# Patient Record
Sex: Female | Born: 1955 | Race: White | Hispanic: No | Marital: Married | Smoking: Current some day smoker
Health system: Southern US, Community
[De-identification: ages and names within clinical notes are randomized; demographics above are authoritative.]

## PROBLEM LIST (undated history)

## (undated) DIAGNOSIS — C50919 Malignant neoplasm of unspecified site of unspecified female breast: Secondary | ICD-10-CM

## (undated) DIAGNOSIS — E785 Hyperlipidemia, unspecified: Secondary | ICD-10-CM

## (undated) DIAGNOSIS — Z923 Personal history of irradiation: Secondary | ICD-10-CM

## (undated) DIAGNOSIS — R112 Nausea with vomiting, unspecified: Secondary | ICD-10-CM

## (undated) DIAGNOSIS — Z9889 Other specified postprocedural states: Secondary | ICD-10-CM

## (undated) HISTORY — PX: TUBAL LIGATION: SHX77

## (undated) HISTORY — PX: BREAST LUMPECTOMY: SHX2

## (undated) HISTORY — DX: Hyperlipidemia, unspecified: E78.5

## (undated) HISTORY — PX: BREAST BIOPSY: SHX20

## (undated) HISTORY — PX: ECTOPIC PREGNANCY SURGERY: SHX613

## (undated) HISTORY — PX: OTHER SURGICAL HISTORY: SHX169

---

## 1998-11-17 ENCOUNTER — Ambulatory Visit (HOSPITAL_COMMUNITY): Admission: RE | Admit: 1998-11-17 | Discharge: 1998-11-17 | Payer: Self-pay | Admitting: Internal Medicine

## 1999-10-18 ENCOUNTER — Other Ambulatory Visit: Admission: RE | Admit: 1999-10-18 | Discharge: 1999-10-18 | Payer: Self-pay | Admitting: Obstetrics & Gynecology

## 2009-10-25 ENCOUNTER — Ambulatory Visit: Payer: Self-pay | Admitting: Internal Medicine

## 2009-10-25 ENCOUNTER — Other Ambulatory Visit: Admission: RE | Admit: 2009-10-25 | Discharge: 2009-10-25 | Payer: Self-pay | Admitting: Internal Medicine

## 2009-11-03 ENCOUNTER — Encounter: Admission: RE | Admit: 2009-11-03 | Discharge: 2009-11-03 | Payer: Self-pay | Admitting: Internal Medicine

## 2009-11-10 ENCOUNTER — Encounter: Admission: RE | Admit: 2009-11-10 | Discharge: 2009-11-10 | Payer: Self-pay | Admitting: Internal Medicine

## 2009-11-18 ENCOUNTER — Encounter: Admission: RE | Admit: 2009-11-18 | Discharge: 2009-11-18 | Payer: Self-pay | Admitting: Internal Medicine

## 2010-01-28 ENCOUNTER — Ambulatory Visit: Payer: Self-pay | Admitting: Internal Medicine

## 2011-03-27 ENCOUNTER — Encounter: Payer: Self-pay | Admitting: Internal Medicine

## 2011-03-30 ENCOUNTER — Other Ambulatory Visit: Payer: Self-pay | Admitting: Internal Medicine

## 2011-03-30 ENCOUNTER — Other Ambulatory Visit: Payer: Private Health Insurance - Indemnity | Admitting: Internal Medicine

## 2011-03-30 DIAGNOSIS — Z0000 Encounter for general adult medical examination without abnormal findings: Secondary | ICD-10-CM

## 2011-03-30 DIAGNOSIS — E785 Hyperlipidemia, unspecified: Secondary | ICD-10-CM

## 2011-03-30 LAB — CBC WITH DIFFERENTIAL/PLATELET
Basophils Absolute: 0.1 10*3/uL (ref 0.0–0.1)
Eosinophils Absolute: 0.2 10*3/uL (ref 0.0–0.7)
MCH: 32.9 pg (ref 26.0–34.0)
MCHC: 33.3 g/dL (ref 30.0–36.0)
MCV: 98.8 fL (ref 78.0–100.0)
Monocytes Relative: 7 % (ref 3–12)
Platelets: 328 10*3/uL (ref 150–400)
RDW: 13.8 % (ref 11.5–15.5)
WBC: 5.9 10*3/uL (ref 4.0–10.5)

## 2011-03-30 LAB — BASIC METABOLIC PANEL
BUN: 9 mg/dL (ref 6–23)
Chloride: 105 mEq/L (ref 96–112)
Potassium: 4.5 mEq/L (ref 3.5–5.3)
Sodium: 140 mEq/L (ref 135–145)

## 2011-03-30 LAB — LIPID PANEL
LDL Cholesterol: 145 mg/dL — ABNORMAL HIGH (ref 0–99)
VLDL: 19 mg/dL (ref 0–40)

## 2011-03-30 LAB — HEPATIC FUNCTION PANEL
Bilirubin, Direct: 0.1 mg/dL (ref 0.0–0.3)
Indirect Bilirubin: 0.3 mg/dL (ref 0.0–0.9)

## 2011-03-30 LAB — VITAMIN D 25 HYDROXY (VIT D DEFICIENCY, FRACTURES): Vit D, 25-Hydroxy: 27 ng/mL — ABNORMAL LOW (ref 30–89)

## 2011-03-31 ENCOUNTER — Other Ambulatory Visit: Payer: Self-pay

## 2011-03-31 ENCOUNTER — Ambulatory Visit (INDEPENDENT_AMBULATORY_CARE_PROVIDER_SITE_OTHER): Payer: Managed Care, Other (non HMO) | Admitting: Internal Medicine

## 2011-03-31 ENCOUNTER — Encounter: Payer: Self-pay | Admitting: Internal Medicine

## 2011-03-31 VITALS — BP 104/72 | HR 76 | Temp 97.9°F | Ht 70.0 in | Wt 168.0 lb

## 2011-03-31 DIAGNOSIS — E559 Vitamin D deficiency, unspecified: Secondary | ICD-10-CM | POA: Insufficient documentation

## 2011-03-31 DIAGNOSIS — Z0000 Encounter for general adult medical examination without abnormal findings: Secondary | ICD-10-CM

## 2011-03-31 DIAGNOSIS — E785 Hyperlipidemia, unspecified: Secondary | ICD-10-CM

## 2011-03-31 DIAGNOSIS — Z87891 Personal history of nicotine dependence: Secondary | ICD-10-CM

## 2011-03-31 LAB — POCT URINALYSIS DIPSTICK
Protein, UA: NEGATIVE
Spec Grav, UA: 1.01
pH, UA: 7

## 2011-03-31 LAB — HEMOGLOBIN A1C: Hgb A1c MFr Bld: 5.9 % — ABNORMAL HIGH (ref <5.7)

## 2011-03-31 NOTE — Progress Notes (Signed)
Subjective:    Patient ID: Jamie Gilbert, female    DOB: [birth year], 55 y.o.   MRN: [id number]  HPI  pleasant white female with history of hyperlipidemia and vitamin D deficiency for physical examination. History of right middle lobe pneumonia January 2000. Had infectious colitis Nov 28, 1998. Ectopic pregnancy and bilateral tubal ligation 1990. Torn medial meniscus of left knee with plica being excised 1997 subsequently developed reflex sympathetic dystrophy post surgery. Last year she had an abnormal mammogram. Breast biopsy was benign. Last year I started her on Zocor 20 mg daily which she has taken with some compliance. Hasn't been taking it regularly. In May she called it was not taking vitamin D supplement. We gave her 50,000 units for 12 weeks. Her level is now 27. She needs to be on 2000 units daily which she finds hard to remember to take. She has been  out of the country a lot. 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 Given DTaP vaccine 2009/11/28. Patient continues to smoke about a pack of cigarettes per day and he smoked for some 20 years. Social alcohol consumption. Doesn't get a lot of exercise other than yard work or walking. Has lost 11 pounds since 11/28/2009.  Family history mother died about 4 years ago at age 1 with an abdominal aortic aneurysm. She had a history of hypertension and was a former smoker. Father died at age 66 with a brain tumor. 3 brothers in their 72s in good health with the exception of high cholesterol. No sisters. 2 adult female children in good health.   In 2011 total cholesterol was 218 with an LDL cholesterol of 244. Labs were reviewed with her today. She needs to continue with same dose of Zocor and take it regularly. She is to have a screening colonoscopy and will consider it. Needs to have mammogram    Review of Systems  Constitutional: Negative.   HENT: Positive for congestion.   Eyes: Negative.     Respiratory: Negative.   Cardiovascular: Negative.   Gastrointestinal: Negative.   Genitourinary: Negative.   Musculoskeletal: Negative.   Neurological: Negative.   Hematological: Negative.   Psychiatric/Behavioral: Negative.        Objective:   Physical Exam  Constitutional: No distress.  HENT:  Head: Normocephalic and atraumatic.  Right Ear: External ear normal.  Left Ear: External ear normal.  Mouth/Throat: Oropharynx is clear and moist. No oropharyngeal exudate.  Eyes: Pupils are equal, round, and reactive to light. Left eye exhibits no discharge. No scleral icterus.  Neck: Neck supple. No JVD present. No thyromegaly present.  Cardiovascular: Normal rate, regular rhythm, normal heart sounds and intact distal pulses.  Exam reveals no gallop.   No murmur heard. Pulmonary/Chest: Effort normal and breath sounds normal. She has no wheezes. She has no rales.  Abdominal: Soft. Bowel sounds are normal. She exhibits no distension and no mass. There is no tenderness. There is no rebound and no guarding.  Genitourinary:       Bimanual normal. Pap last year was normal.  Musculoskeletal: Normal range of motion. She exhibits no edema.  Lymphadenopathy:    She has no cervical adenopathy.  Skin: Skin is warm and dry. No rash noted. She is not diaphoretic.          Assessment & Plan:  Hyperlipidemia: Patient has not been entirely compliant with Zocor 20 mg daily. Continue same dose and recheck in 6 months.  Vitamin D  deficiency: Vitamin D level is 27. Recommend 2000 international units vitamin D 3 over-the-counter daily.  Health maintenance needs mammogram and colonoscopy

## 2011-03-31 NOTE — Patient Instructions (Signed)
Counseled regarding to stopping smoking but she is really not itched it at this point in time. Take lipid medication on a regular basis. Take vitamin D 2000 units daily. See in 6 months at which time repeat lipid panel liver functions and office visit

## 2011-04-03 ENCOUNTER — Encounter: Payer: Self-pay | Admitting: Internal Medicine

## 2011-10-16 ENCOUNTER — Other Ambulatory Visit (INDEPENDENT_AMBULATORY_CARE_PROVIDER_SITE_OTHER): Payer: Managed Care, Other (non HMO) | Admitting: Internal Medicine

## 2011-10-16 DIAGNOSIS — Z79899 Other long term (current) drug therapy: Secondary | ICD-10-CM

## 2011-10-16 DIAGNOSIS — E785 Hyperlipidemia, unspecified: Secondary | ICD-10-CM

## 2011-10-16 LAB — LIPID PANEL
Total CHOL/HDL Ratio: 6 Ratio
Triglycerides: 102 mg/dL (ref <150)

## 2011-10-16 LAB — HEPATIC FUNCTION PANEL
ALT: 11 U/L (ref 0–35)
Albumin: 4.6 g/dL (ref 3.5–5.2)
Bilirubin, Direct: 0.1 mg/dL (ref 0.0–0.3)
Total Protein: 6.9 g/dL (ref 6.0–8.3)

## 2011-10-16 LAB — HEMOGLOBIN A1C: Mean Plasma Glucose: 120 mg/dL — ABNORMAL HIGH (ref <117)

## 2011-10-17 ENCOUNTER — Ambulatory Visit (INDEPENDENT_AMBULATORY_CARE_PROVIDER_SITE_OTHER): Payer: Managed Care, Other (non HMO) | Admitting: Internal Medicine

## 2011-10-17 ENCOUNTER — Encounter: Payer: Self-pay | Admitting: Internal Medicine

## 2011-10-17 VITALS — BP 120/84 | HR 76 | Temp 97.7°F | Wt 170.0 lb

## 2011-10-17 DIAGNOSIS — J40 Bronchitis, not specified as acute or chronic: Secondary | ICD-10-CM

## 2011-10-17 DIAGNOSIS — H6690 Otitis media, unspecified, unspecified ear: Secondary | ICD-10-CM

## 2011-10-17 DIAGNOSIS — H6691 Otitis media, unspecified, right ear: Secondary | ICD-10-CM

## 2011-10-17 DIAGNOSIS — E785 Hyperlipidemia, unspecified: Secondary | ICD-10-CM

## 2011-10-17 DIAGNOSIS — IMO0002 Reserved for concepts with insufficient information to code with codable children: Secondary | ICD-10-CM

## 2011-10-17 DIAGNOSIS — Z7189 Other specified counseling: Secondary | ICD-10-CM

## 2011-10-17 DIAGNOSIS — J329 Chronic sinusitis, unspecified: Secondary | ICD-10-CM

## 2011-10-17 NOTE — Patient Instructions (Signed)
Take Levaquin 500 milligrams daily for 10 days for bronchitis and sinusitis. For international travel, please take with you Cipro 500 mg twice daily for 7 days and Phenergan 25 mg tablets 1 by mouth every 4 hours when necessary nausea. Please be compliant with Zocor. Return in 6 months for physical exam.

## 2011-10-17 NOTE — Progress Notes (Signed)
  Subjective:    Patient ID: Jamie Gilbert, female    DOB: 02-01-1956, 56 y.o.   MRN: [id number]  HPI  56 year old white female long-standing history of hyperlipidemia treated with Zocor 40 mg daily. Admits to not being compliant with Zocor over the holidays. Fasting lipid panel certainly confirms that with total cholesterol being in the 300 range. Patient has been traveling a great deal. Has upcoming trips to [place] and [place]. Husband has a winery in [place] with plans to travel there in the spring.   Also has discolored sputum production and nasal congestion. Patient is a smoker. Has had respiratory congestion for a couple of weeks. No fever or shaking chills. Did not take influenza immunization.    Review of Systems     Objective:   Physical ExamHEENT exam right TM is red and full, left TM clear, pharynx is slightly injected without exudate, neck is supple without significant adenopathy, chest clear to auscultation. She sounds nasally congested.        Assessment & Plan:    Hyperlipidemia poorly controlled because of noncompliance with Zocor  Sinusitis  Bronchitis  Right otitis media  Smoker  Travel advice  Plan: Encouraged patient to be compliant with Zocor 40 mg daily. Return in 6 months for physical examination.  Prescription for Zocor 40 mg one by mouth daily #30 with 5 refills. Treat bronchitis and sinusitis with Levaquin 500 milligrams daily for 10 days. She declines offer for cough medication. 4 traveling to foreign countries, Cipro 500 mg by mouth twice daily #14 with one refill as well as Phenergan 25 mg tablet spritzers #30) 1 by mouth every 4 hours when necessary nausea with one refill.

## 2011-10-31 ENCOUNTER — Other Ambulatory Visit: Payer: Self-pay

## 2011-10-31 ENCOUNTER — Telehealth: Payer: Self-pay | Admitting: Internal Medicine

## 2011-10-31 DIAGNOSIS — J329 Chronic sinusitis, unspecified: Secondary | ICD-10-CM

## 2011-10-31 MED ORDER — CLARITHROMYCIN 500 MG PO TABS: 500.0000 mg | ORAL_TABLET | Freq: Two times a day (BID) | ORAL | Status: AC

## 2011-10-31 NOTE — Telephone Encounter (Signed)
Call in Biaxin 500mg  twice daily for 10 days to [facility].

## 2012-04-16 ENCOUNTER — Other Ambulatory Visit: Payer: Managed Care, Other (non HMO) | Admitting: Internal Medicine

## 2012-04-18 ENCOUNTER — Encounter: Payer: Managed Care, Other (non HMO) | Admitting: Internal Medicine

## 2012-11-25 ENCOUNTER — Other Ambulatory Visit: Payer: Self-pay

## 2012-11-25 MED ORDER — SIMVASTATIN 20 MG PO TABS: 20.0000 mg | ORAL_TABLET | Freq: Every day | ORAL | Status: DC

## 2013-01-16 ENCOUNTER — Other Ambulatory Visit: Payer: Managed Care, Other (non HMO) | Admitting: Internal Medicine

## 2013-01-16 LAB — CBC WITH DIFFERENTIAL/PLATELET
Basophils Relative: 1 % (ref 0–1)
Eosinophils Absolute: 0.2 10*3/uL (ref 0.0–0.7)
Eosinophils Relative: 4 % (ref 0–5)
MCH: 30.7 pg (ref 26.0–34.0)
Monocytes Absolute: 0.4 10*3/uL (ref 0.1–1.0)
Monocytes Relative: 7 % (ref 3–12)
Neutrophils Relative %: 52 % (ref 43–77)
RBC: 4.43 MIL/uL (ref 3.87–5.11)
RDW: 14.6 % (ref 11.5–15.5)
WBC: 5.4 10*3/uL (ref 4.0–10.5)

## 2013-01-16 LAB — LIPID PANEL
HDL: 73 mg/dL (ref >39)
LDL Cholesterol: 207 mg/dL — ABNORMAL HIGH (ref 0–99)

## 2013-01-16 LAB — COMPREHENSIVE METABOLIC PANEL
ALT: 13 U/L (ref 0–35)
AST: 16 U/L (ref 0–37)
Albumin: 4.4 g/dL (ref 3.5–5.2)
Alkaline Phosphatase: 59 U/L (ref 39–117)
Calcium: 9.4 mg/dL (ref 8.4–10.5)
Potassium: 4.1 mEq/L (ref 3.5–5.3)
Total Protein: 6.8 g/dL (ref 6.0–8.3)

## 2013-01-17 ENCOUNTER — Ambulatory Visit (INDEPENDENT_AMBULATORY_CARE_PROVIDER_SITE_OTHER): Payer: Managed Care, Other (non HMO) | Admitting: Internal Medicine

## 2013-01-17 ENCOUNTER — Encounter: Payer: Self-pay | Admitting: Internal Medicine

## 2013-01-17 ENCOUNTER — Other Ambulatory Visit (HOSPITAL_COMMUNITY)
Admission: RE | Admit: 2013-01-17 | Discharge: 2013-01-17 | Disposition: A | Discharge status: Home / Self Care | Payer: Managed Care, Other (non HMO) | Source: Ambulatory Visit | Attending: Internal Medicine | Admitting: Internal Medicine

## 2013-01-17 VITALS — BP 118/72 | HR 72 | Temp 97.1°F | Wt 168.0 lb

## 2013-01-17 DIAGNOSIS — Z0000 Encounter for general adult medical examination without abnormal findings: Secondary | ICD-10-CM

## 2013-01-17 DIAGNOSIS — Z01419 Encounter for gynecological examination (general) (routine) without abnormal findings: Secondary | ICD-10-CM | POA: Insufficient documentation

## 2013-01-17 DIAGNOSIS — E785 Hyperlipidemia, unspecified: Secondary | ICD-10-CM

## 2013-01-17 DIAGNOSIS — IMO0001 Reserved for inherently not codable concepts without codable children: Secondary | ICD-10-CM

## 2013-01-17 DIAGNOSIS — F17200 Nicotine dependence, unspecified, uncomplicated: Secondary | ICD-10-CM

## 2013-01-17 DIAGNOSIS — Z8639 Personal history of other endocrine, nutritional and metabolic disease: Secondary | ICD-10-CM

## 2013-01-17 LAB — POCT URINALYSIS DIPSTICK
Bilirubin, UA: NEGATIVE
Blood, UA: NEGATIVE
Glucose, UA: NEGATIVE
pH, UA: 6

## 2013-01-17 LAB — VITAMIN D 25 HYDROXY (VIT D DEFICIENCY, FRACTURES): Vit D, 25-Hydroxy: 20 ng/mL — ABNORMAL LOW (ref 30–89)

## 2013-01-17 MED ORDER — SIMVASTATIN 20 MG PO TABS: 40.0000 mg | ORAL_TABLET | Freq: Every day | ORAL | Status: DC

## 2013-02-16 NOTE — Progress Notes (Signed)
  Subjective:    Patient ID: Jamie Gilbert, female    DOB: Nov 12, 1955, 57 y.o.   MRN: [id number]  HPI 57 year old white female with history of hyperlipidemia and vitamin D deficiency. Has not been taking lipid-lowering medication.  History of right middle lobe pneumonia January 2000. Had infectious colitis February 2000. Ectopic pregnancy and bilateral tubal ligation 1990. Torn medial meniscus of left knee with plica being excised in 1997 and subsequently developed regional pain syndrome post surgery. History of abnormal mammogram 2011. Breast biopsy was benign. In 2011 she was started on Zocor which she has not been compliant with from when initial prescription was originated. Also hasn't been taking vitamin D supplementation.  Patient continues to smoke a pack of cigarettes daily and has smoked for over 20 years. Social alcohol consumption.  Social history: This is her second marriage. Husband owns a farm in [place] and they are making wine there. 2 adult female children in good health. No children from second marriage.  Family history: Mother died with abdominal aortic aneurysm at age 35. She had history of hypertension and was a former smoker. Father died at age 71 with a brain tumor. 3 brothers in their 64s in good health with the exception of hyperlipidemia. No sisters.    Review of Systems  Constitutional: Negative.   All other systems reviewed and are negative.       Objective:   Physical Exam  Vitals reviewed. Constitutional: She is oriented to person, place, and time. She appears well-developed and well-nourished. No distress.  HENT:  Head: Normocephalic.  Right Ear: External ear normal.  Left Ear: External ear normal.  Mouth/Throat: Oropharynx is clear and moist. No oropharyngeal exudate.  Eyes: Conjunctivae and EOM are normal. Right eye exhibits no discharge. Left eye exhibits no discharge. No scleral icterus.  Neck: Neck supple. No JVD present. No thyromegaly present.   Cardiovascular: Normal rate, regular rhythm, normal heart sounds and intact distal pulses.   No murmur heard. Pulmonary/Chest: Effort normal and breath sounds normal. No respiratory distress. She has no wheezes. She has no rales. She exhibits no tenderness.  Breasts normal female  Abdominal: Soft. Bowel sounds are normal. She exhibits no distension and no mass. There is no tenderness. There is no rebound and no guarding.  Genitourinary: Vagina normal.  Pap taken. Bimanual normal.  Musculoskeletal: She exhibits no edema.  Lymphadenopathy:    She has no cervical adenopathy.  Neurological: She is alert and oriented to person, place, and time. She has normal reflexes. No cranial nerve deficit. Coordination normal.  Skin: Skin is warm and dry. No rash noted. She is not diaphoretic.  Psychiatric: She has a normal mood and affect. Her behavior is normal. Judgment and thought content normal.          Assessment & Plan:  Hyperlipidemia. Patient agrees to go back on Zocor. Total cholesterol 298 with LDL cholesterol 207 off Zocor  History of vitamin D deficiency. Recommend 2000 units vitamin D 3 daily  History of smoking-patient does not want to quit  Health maintenance: Recommend mammogram and colonoscopy as well as bone density study  Plan: Return in 6 months for office visit lipid panel liver functions if compliant with Zocor

## 2013-02-16 NOTE — Patient Instructions (Addendum)
Reminded to quit smoking. Patient is not ready. Take Zocor as directed for high cholesterol. Return in 6 months. Take vitamin D supplement daily. Consider colonoscopy. Have mammogram and bone density study.

## 2013-02-26 ENCOUNTER — Telehealth: Payer: Self-pay | Admitting: Internal Medicine

## 2013-02-27 ENCOUNTER — Other Ambulatory Visit: Payer: Self-pay

## 2013-02-27 DIAGNOSIS — G894 Chronic pain syndrome: Secondary | ICD-10-CM

## 2013-02-27 MED ORDER — IBUPROFEN 800 MG PO TABS: 800.0000 mg | ORAL_TABLET | Freq: Three times a day (TID) | ORAL | Status: DC | PRN

## 2013-02-27 NOTE — Telephone Encounter (Signed)
Will do. Please prescibe Ibuprofen 800 mg tid with no  Refills #90 for patient.

## 2013-04-22 ENCOUNTER — Other Ambulatory Visit: Payer: Self-pay | Admitting: Internal Medicine

## 2013-08-29 ENCOUNTER — Telehealth: Payer: Self-pay | Admitting: Internal Medicine

## 2013-08-29 ENCOUNTER — Ambulatory Visit
Admission: RE | Admit: 2013-08-29 | Discharge: 2013-08-29 | Disposition: A | Discharge status: Home / Self Care | Payer: Managed Care, Other (non HMO) | Source: Ambulatory Visit | Attending: Internal Medicine | Admitting: Internal Medicine

## 2013-08-29 ENCOUNTER — Ambulatory Visit (INDEPENDENT_AMBULATORY_CARE_PROVIDER_SITE_OTHER): Payer: Managed Care, Other (non HMO) | Admitting: Internal Medicine

## 2013-08-29 ENCOUNTER — Encounter: Payer: Self-pay | Admitting: Internal Medicine

## 2013-08-29 VITALS — BP 132/80 | HR 84 | Temp 97.9°F | Ht 70.0 in | Wt 173.0 lb

## 2013-08-29 DIAGNOSIS — J209 Acute bronchitis, unspecified: Secondary | ICD-10-CM

## 2013-08-29 DIAGNOSIS — J189 Pneumonia, unspecified organism: Secondary | ICD-10-CM

## 2013-08-29 DIAGNOSIS — J9801 Acute bronchospasm: Secondary | ICD-10-CM

## 2013-08-29 DIAGNOSIS — Z87891 Personal history of nicotine dependence: Secondary | ICD-10-CM

## 2013-08-29 MED ORDER — CEFTRIAXONE SODIUM 1 G IJ SOLR
1.0000 g | Freq: Once | INTRAMUSCULAR | Status: AC
  Administered 2013-08-29: 1 g via INTRAMUSCULAR

## 2013-08-29 MED ORDER — LEVOFLOXACIN 500 MG PO TABS: 500.0000 mg | ORAL_TABLET | Freq: Every day | ORAL | Status: DC

## 2013-08-29 MED ORDER — ALBUTEROL SULFATE HFA 108 (90 BASE) MCG/ACT IN AERS: 2.0000 | INHALATION_SPRAY | Freq: Four times a day (QID) | RESPIRATORY_TRACT | Status: DC | PRN

## 2013-08-29 NOTE — Patient Instructions (Signed)
Take Levaquin 500 milligrams daily for 10 days. Use Proventil inhaler 2 sprays 4 times daily. If wheezing and congestion worsen, start Medrol 4 mg 6 day dosepak. Have chest x-ray.

## 2013-08-29 NOTE — Progress Notes (Signed)
   Subjective:    Patient ID: Jamie Gilbert, female    DOB: 1956-03-11, 57 y.o.   MRN: [id number]  HPI  Smoking one ppd. Came down with URI symptoms after Thanksgiving. No fever. No chills. Discolored sputum production. No SOB. Has malaise and fatigue. Counsel regard smoking cessation. Has been traveling a lot.    Review of Systems     Objective:   Physical Exam TMs are slightly full bilaterally. Pharynx is clear. Neck is supple without adenopathy. Chest bilateral rhonchi. No distinct wheezing. No distinct rales.        Assessment & Plan:  Bronchitis  History smoking  Plan: Chest x-ray PA and lateral. Levaquin 500 milligrams daily for 10 days. If coughing and wheezing persist, try Medrol 4 mg 6 day dosepak. Ventolin inhaler 2 sprays by mouth 4 times a day.  Addendum: Likely has early pneumonia. Take medication as prescribed return here in 2 weeks for followup visit and chest x-ray. Needs to quit smoking. Syncopal and in and recurrent and is a car and in massage prescription she's on was levels are running we'll [place] and thought this is an will is on her stool and she will and referred to the nearest wants to fall at the site sounds 1 appears to this for a reck in 1 and lives in a.

## 2013-08-29 NOTE — Telephone Encounter (Signed)
Verbalized understanding of these instructions.

## 2013-09-01 ENCOUNTER — Other Ambulatory Visit: Payer: Self-pay | Admitting: *Deleted

## 2013-09-01 DIAGNOSIS — J189 Pneumonia, unspecified organism: Secondary | ICD-10-CM

## 2013-09-09 ENCOUNTER — Ambulatory Visit
Admission: RE | Admit: 2013-09-09 | Discharge: 2013-09-09 | Disposition: A | Discharge status: Home / Self Care | Payer: Managed Care, Other (non HMO) | Source: Ambulatory Visit | Attending: Internal Medicine | Admitting: Internal Medicine

## 2013-09-09 ENCOUNTER — Encounter: Payer: Self-pay | Admitting: Internal Medicine

## 2013-09-09 ENCOUNTER — Ambulatory Visit (INDEPENDENT_AMBULATORY_CARE_PROVIDER_SITE_OTHER): Payer: Managed Care, Other (non HMO) | Admitting: Internal Medicine

## 2013-09-09 VITALS — BP 140/70 | HR 80 | Temp 98.0°F | Resp 18 | Wt 170.0 lb

## 2013-09-09 DIAGNOSIS — Z8709 Personal history of other diseases of the respiratory system: Secondary | ICD-10-CM

## 2013-09-09 DIAGNOSIS — J189 Pneumonia, unspecified organism: Secondary | ICD-10-CM

## 2013-09-09 MED ORDER — LEVOFLOXACIN 500 MG PO TABS: 500.0000 mg | ORAL_TABLET | Freq: Every day | ORAL | Status: DC

## 2013-09-14 DIAGNOSIS — Z8709 Personal history of other diseases of the respiratory system: Secondary | ICD-10-CM | POA: Insufficient documentation

## 2013-09-14 NOTE — Patient Instructions (Signed)
Please quit smoking. Return as needed.

## 2013-09-14 NOTE — Progress Notes (Signed)
   Subjective:    Patient ID: Jamie Gilbert, female    DOB: [birth year], 57 y.o.   MRN: [id number]  HPI On December 12 presented with respiratory infection and chest x-ray was consistent with developing pneumonia in the left retrocardiac area. She still feels quite fatigued. Chest x-ray has improved. She is feeling better. Spoke with her today about smoking cessation. She realizes she needs to quit. She's asking about electronic cigarettes. I would prefer she try nicotine patches instead or some type of quit smoking program. Chest x-ray did show COPD and she was told this today. She did not take steroids as previously prescribed just the antibiotic.    Review of Systems     Objective:   Physical Exam Neck is supple without JVD thyromegaly or carotid bruits. Chest clear to auscultation.       Assessment & Plan:  Resolving pneumonia  COPD  History of smoking  Plan: Counseled regarding smoking cessation. Return as needed. Suggested she may want to participate in classes held by [facility].

## 2013-11-28 ENCOUNTER — Telehealth: Payer: Self-pay | Admitting: Internal Medicine

## 2013-11-28 DIAGNOSIS — R110 Nausea: Secondary | ICD-10-CM

## 2013-11-28 NOTE — Telephone Encounter (Signed)
Call in Cipro 500 mg bid x 10 days (#20) and Phenergan 25 mg tabs #30 one po q 4 hours prn nausea for trip to [place].

## 2013-12-01 ENCOUNTER — Other Ambulatory Visit: Payer: Self-pay

## 2013-12-01 MED ORDER — CIPROFLOXACIN HCL 500 MG PO TABS: 500.0000 mg | ORAL_TABLET | Freq: Two times a day (BID) | ORAL | Status: DC

## 2013-12-01 MED ORDER — PROMETHAZINE HCL 25 MG PO TABS: 25.0000 mg | ORAL_TABLET | ORAL | Status: DC | PRN

## 2013-12-01 NOTE — Telephone Encounter (Signed)
Patient informed that Rx sent to [facility].

## 2014-03-26 ENCOUNTER — Other Ambulatory Visit: Payer: Self-pay | Admitting: Internal Medicine

## 2014-06-01 ENCOUNTER — Other Ambulatory Visit: Payer: Self-pay

## 2014-06-01 DIAGNOSIS — Z1231 Encounter for screening mammogram for malignant neoplasm of breast: Secondary | ICD-10-CM

## 2014-06-10 ENCOUNTER — Ambulatory Visit
Admission: RE | Admit: 2014-06-10 | Discharge: 2014-06-10 | Disposition: A | Discharge status: Home / Self Care | Payer: Managed Care, Other (non HMO) | Source: Ambulatory Visit

## 2014-06-10 DIAGNOSIS — Z1231 Encounter for screening mammogram for malignant neoplasm of breast: Secondary | ICD-10-CM

## 2014-08-20 ENCOUNTER — Encounter: Payer: Self-pay | Admitting: Internal Medicine

## 2014-08-20 ENCOUNTER — Ambulatory Visit (INDEPENDENT_AMBULATORY_CARE_PROVIDER_SITE_OTHER): Payer: Managed Care, Other (non HMO) | Admitting: Internal Medicine

## 2014-08-20 VITALS — BP 120/78 | HR 71 | Temp 98.6°F | Ht 70.0 in | Wt 171.0 lb

## 2014-08-20 DIAGNOSIS — J209 Acute bronchitis, unspecified: Secondary | ICD-10-CM

## 2014-08-20 MED ORDER — LEVOFLOXACIN 500 MG PO TABS: 500.0000 mg | ORAL_TABLET | Freq: Every day | ORAL | Status: DC

## 2014-08-20 NOTE — Progress Notes (Signed)
   Subjective:    Patient ID: Jamie Gilbert, female    DOB: [birth year], 58 y.o.   MRN: [id number]  HPI 2 day history of cough and nasal congestion. Cough sounds deep and congested. No fever or shaking chills. Had similar illness in December last year. Going out of town in the next few days. Wanted to get seen as soon as possible. She continues to smoke. Have tried to get her to quit in the past.    Review of Systems     Objective:   Physical Exam   Skin warm and dry. Nodes none. TMs are clear. Pharynx is clear. Neck supple. Chest clear to auscultation without rales or wheezing. Deep congested cough     Assessment & Plan:  Acute bronchitis  Plan: Levaquin 500 milligrams daily for 10 days. Declines offer for cough syrup. Does not want inhaler.

## 2014-08-20 NOTE — Patient Instructions (Signed)
Levaquin 500 mg daily x 10 days. Declines cough medication.

## 2014-08-28 ENCOUNTER — Ambulatory Visit: Payer: Self-pay | Admitting: Internal Medicine

## 2014-08-28 ENCOUNTER — Other Ambulatory Visit: Payer: Self-pay

## 2014-08-28 ENCOUNTER — Telehealth: Payer: Self-pay | Admitting: Internal Medicine

## 2014-08-28 DIAGNOSIS — R059 Cough, unspecified: Secondary | ICD-10-CM

## 2014-08-28 DIAGNOSIS — R6883 Chills (without fever): Secondary | ICD-10-CM

## 2014-08-28 DIAGNOSIS — R05 Cough: Secondary | ICD-10-CM

## 2014-08-28 MED ORDER — LEVOFLOXACIN 500 MG PO TABS: 500.0000 mg | ORAL_TABLET | Freq: Every day | ORAL | Status: DC

## 2014-08-28 NOTE — Telephone Encounter (Signed)
No fever the last 3-4 days, still having some chills at night.  Still not feeling her best after 8 days of the antibiotics.  Do you want to see her back?  Or, do you think she needs another round of antibiotics?  Please advise?    [phone]

## 2014-08-28 NOTE — Telephone Encounter (Signed)
Chest xray ordered.  Left message for patient to get xray and follow up in the office 08/28/2014 at 315.

## 2014-08-28 NOTE — Telephone Encounter (Signed)
She needs CXR and office visit today.

## 2014-08-31 ENCOUNTER — Ambulatory Visit (INDEPENDENT_AMBULATORY_CARE_PROVIDER_SITE_OTHER): Payer: Managed Care, Other (non HMO) | Admitting: Internal Medicine

## 2014-08-31 ENCOUNTER — Encounter: Payer: Self-pay | Admitting: Internal Medicine

## 2014-08-31 ENCOUNTER — Ambulatory Visit
Admission: RE | Admit: 2014-08-31 | Discharge: 2014-08-31 | Disposition: A | Discharge status: Home / Self Care | Payer: Managed Care, Other (non HMO) | Source: Ambulatory Visit | Attending: Internal Medicine | Admitting: Internal Medicine

## 2014-08-31 VITALS — BP 120/80 | HR 72 | Temp 97.6°F | Wt 171.0 lb

## 2014-08-31 DIAGNOSIS — Z720 Tobacco use: Secondary | ICD-10-CM

## 2014-08-31 DIAGNOSIS — J209 Acute bronchitis, unspecified: Secondary | ICD-10-CM

## 2014-08-31 DIAGNOSIS — R05 Cough: Secondary | ICD-10-CM

## 2014-08-31 DIAGNOSIS — R059 Cough, unspecified: Secondary | ICD-10-CM

## 2014-08-31 DIAGNOSIS — Z87891 Personal history of nicotine dependence: Secondary | ICD-10-CM

## 2014-08-31 DIAGNOSIS — R6883 Chills (without fever): Secondary | ICD-10-CM

## 2014-08-31 NOTE — Patient Instructions (Addendum)
Complete another 10 day course of Levaquin that was prescribed December 11. She should pick it up today. Call if not better after that course of antibiotics.

## 2014-08-31 NOTE — Progress Notes (Signed)
   Subjective:    Patient ID: Jamie Gilbert, female    DOB: 03/17/1956, 58 y.o.   MRN: [id number]  HPI  She called on Friday, December 11 saying that she still had chills malaise and fatigue with cough. Would have completed 10 day course of Levaquin on December 13. 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 We suggested she have a chest x-ray but she decided to wait until today to do that. We refilled her Levaquin for an additional 10 days on December 11 but she has yet to pick that up, however would have not completed initial ten-day course of antibiotics until December 13. Says she's beginning to feel better.    Review of Systems     Objective:   Physical Exam Skin is warm and dry. She looks fatigued. Neck is supple. No adenopathy. Chest clear to auscultation. Chest x-ray no pneumonia       Assessment & Plan:  Bronchitis  Plan: Complete additional prescription of Levaquin 500 mg for 10 days that was called in on December 11. Call if symptoms do not improve.

## 2014-11-13 ENCOUNTER — Other Ambulatory Visit: Payer: Self-pay | Admitting: Internal Medicine

## 2014-11-16 ENCOUNTER — Other Ambulatory Visit: Payer: Self-pay | Admitting: Internal Medicine

## 2014-11-17 ENCOUNTER — Other Ambulatory Visit: Payer: Self-pay | Admitting: *Deleted

## 2014-11-17 MED ORDER — SIMVASTATIN 40 MG PO TABS: ORAL_TABLET | ORAL | Status: DC

## 2014-11-17 NOTE — Telephone Encounter (Signed)
Refill for zocor called into patient pharmacy

## 2014-11-19 ENCOUNTER — Other Ambulatory Visit: Payer: Self-pay | Admitting: Internal Medicine

## 2015-05-11 ENCOUNTER — Other Ambulatory Visit: Payer: Self-pay | Admitting: Internal Medicine

## 2015-05-12 NOTE — Telephone Encounter (Signed)
Last lipid panel and CPE 2014.

## 2015-06-10 ENCOUNTER — Other Ambulatory Visit: Payer: Managed Care, Other (non HMO) | Admitting: Internal Medicine

## 2015-06-10 ENCOUNTER — Other Ambulatory Visit: Payer: Self-pay | Admitting: Internal Medicine

## 2015-06-10 DIAGNOSIS — Z1329 Encounter for screening for other suspected endocrine disorder: Secondary | ICD-10-CM

## 2015-06-10 DIAGNOSIS — Z130 Encounter for screening for diseases of the blood and blood-forming organs and certain disorders involving the immune mechanism: Secondary | ICD-10-CM

## 2015-06-10 DIAGNOSIS — Z0000 Encounter for general adult medical examination without abnormal findings: Secondary | ICD-10-CM

## 2015-06-10 DIAGNOSIS — Z1321 Encounter for screening for nutritional disorder: Secondary | ICD-10-CM

## 2015-06-10 DIAGNOSIS — Z13220 Encounter for screening for lipoid disorders: Secondary | ICD-10-CM

## 2015-06-10 LAB — COMPLETE METABOLIC PANEL WITH GFR
ALK PHOS: 56 U/L (ref 33–130)
ALT: 13 U/L (ref 6–29)
AST: 18 U/L (ref 10–35)
Albumin: 4.6 g/dL (ref 3.6–5.1)
BILIRUBIN TOTAL: 0.6 mg/dL (ref 0.2–1.2)
BUN: 15 mg/dL (ref 7–25)
CO2: 25 mmol/L (ref 20–31)
CREATININE: 0.77 mg/dL (ref 0.50–1.05)
Calcium: 9.5 mg/dL (ref 8.6–10.4)
Chloride: 104 mmol/L (ref 98–110)
GFR, Est African American: >89 mL/min (ref >=60)
GFR, Est Non African American: 85 mL/min (ref >=60)
Glucose, Bld: 80 mg/dL (ref 65–99)
Potassium: 4.4 mmol/L (ref 3.5–5.3)
Sodium: 141 mmol/L (ref 135–146)
TOTAL PROTEIN: 7.2 g/dL (ref 6.1–8.1)

## 2015-06-10 LAB — CBC WITH DIFFERENTIAL/PLATELET
BASOS ABS: 0.1 10*3/uL (ref 0.0–0.1)
Basophils Relative: 1 % (ref 0–1)
Eosinophils Absolute: 0.1 10*3/uL (ref 0.0–0.7)
Eosinophils Relative: 1 % (ref 0–5)
HEMATOCRIT: 43.2 % (ref 36.0–46.0)
Hemoglobin: 14.3 g/dL (ref 12.0–15.0)
LYMPHS ABS: 2.1 10*3/uL (ref 0.7–4.0)
LYMPHS PCT: 37 % (ref 12–46)
MCH: 32.4 pg (ref 26.0–34.0)
MCHC: 33.1 g/dL (ref 30.0–36.0)
MCV: 97.7 fL (ref 78.0–100.0)
MPV: 9.3 fL (ref 8.6–12.4)
Monocytes Absolute: 0.4 10*3/uL (ref 0.1–1.0)
Monocytes Relative: 7 % (ref 3–12)
NEUTROS ABS: 3 10*3/uL (ref 1.7–7.7)
Neutrophils Relative %: 54 % (ref 43–77)
PLATELETS: 327 10*3/uL (ref 150–400)
RBC: 4.42 MIL/uL (ref 3.87–5.11)
RDW: 14.2 % (ref 11.5–15.5)
WBC: 5.6 10*3/uL (ref 4.0–10.5)

## 2015-06-10 LAB — LIPID PANEL
CHOL/HDL RATIO: 4 ratio (ref <=5.0)
Cholesterol: 294 mg/dL — ABNORMAL HIGH (ref 125–200)
HDL: 74 mg/dL (ref >=46)
LDL Cholesterol: 204 mg/dL — ABNORMAL HIGH (ref <130)
TRIGLYCERIDES: 80 mg/dL (ref <150)
VLDL: 16 mg/dL (ref <30)

## 2015-06-11 ENCOUNTER — Encounter: Payer: Self-pay | Admitting: Internal Medicine

## 2015-06-11 ENCOUNTER — Ambulatory Visit (INDEPENDENT_AMBULATORY_CARE_PROVIDER_SITE_OTHER): Payer: Managed Care, Other (non HMO) | Admitting: Internal Medicine

## 2015-06-11 VITALS — BP 118/78 | HR 82 | Temp 98.0°F | Ht 70.0 in | Wt 174.0 lb

## 2015-06-11 DIAGNOSIS — E2839 Other primary ovarian failure: Secondary | ICD-10-CM

## 2015-06-11 DIAGNOSIS — Z0000 Encounter for general adult medical examination without abnormal findings: Secondary | ICD-10-CM

## 2015-06-11 DIAGNOSIS — E785 Hyperlipidemia, unspecified: Secondary | ICD-10-CM

## 2015-06-11 DIAGNOSIS — Z720 Tobacco use: Secondary | ICD-10-CM

## 2015-06-11 DIAGNOSIS — E559 Vitamin D deficiency, unspecified: Secondary | ICD-10-CM

## 2015-06-11 DIAGNOSIS — Z87891 Personal history of nicotine dependence: Secondary | ICD-10-CM

## 2015-06-11 LAB — POCT URINALYSIS DIPSTICK
BILIRUBIN UA: NEGATIVE
Blood, UA: NEGATIVE
GLUCOSE UA: NEGATIVE
Ketones, UA: NEGATIVE
Leukocytes, UA: NEGATIVE
Nitrite, UA: NEGATIVE
Protein, UA: NEGATIVE
Spec Grav, UA: 1.02
Urobilinogen, UA: NEGATIVE
pH, UA: 5

## 2015-06-11 LAB — TSH: TSH: 2.208 u[IU]/mL (ref 0.350–4.500)

## 2015-06-11 LAB — HEMOGLOBIN A1C
HEMOGLOBIN A1C: 5.8 % — AB (ref <5.7)
Mean Plasma Glucose: 120 mg/dL — ABNORMAL HIGH (ref <117)

## 2015-06-11 LAB — VITAMIN D 25 HYDROXY (VIT D DEFICIENCY, FRACTURES): Vit D, 25-Hydroxy: 18 ng/mL — ABNORMAL LOW (ref 30–100)

## 2015-06-11 MED ORDER — SIMVASTATIN 40 MG PO TABS: ORAL_TABLET | ORAL | Status: DC

## 2015-06-12 ENCOUNTER — Encounter: Payer: Self-pay | Admitting: Internal Medicine

## 2015-06-12 NOTE — Patient Instructions (Signed)
Restart statin medication. Take 2000 units vitamin D 3 daily. Have mammogram and colonoscopy. Return in 6 months. It was a pleasure to see you today.

## 2015-06-12 NOTE — Progress Notes (Signed)
   Subjective:    Patient ID: Jamie Gilbert, female    DOB: 23-Feb-1956, 59 y.o.   MRN: [id number]  HPI 59 year old White Female in today for health maintenance examination and evaluation of medical problems. Has not been taking lipid-lowering medication in several weeks. History of hyperlipidemia, history of smoking, history of vitamin D deficiency. Was overdue for health maintenance exam and ran out of medication.  History of right middle lobe pneumonia January 2000. Infectious colitis February 2000. Ectopic pregnancy and bilateral tubal ligation 1990. Torn medial meniscus of left knee with Pleak of being excised 1997. Subsequently developed regional pain syndrome post surgery. History of abnormal mammogram 2011 with benign breast biopsy. In 2011, she was started on Zocor.  Patient used to smoke one to 2 packs of cigarettes daily. Is now down to a couple of packs a week. Social alcohol consumption.  Social history: This is her second marriage. 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 2 adult female children in good health. No children from second marriage. She travels extensively with her husband.  Family history: Mother died with abdominal aortic aneurysm and age 25. She had a history of hypertension and was a former smoker. Father died at age 24 with a brain tumor. 3 brothers in their 79s in good health with the exception of hyperlipidemia. No sisters.      Review of Systems  Constitutional: Negative.   Respiratory: Negative.   Cardiovascular: Negative.   Gastrointestinal: Negative.   Genitourinary: Negative.   All other systems reviewed and are negative.      Objective:   Physical Exam  Constitutional: She is oriented to person, place, and time. She appears well-developed and well-nourished. No distress.  HENT:  Head: Normocephalic and atraumatic.  Right Ear: External ear normal.  Left Ear: External ear normal.  Mouth/Throat: Oropharynx is clear  and moist. No oropharyngeal exudate.  Eyes: Conjunctivae and EOM are normal. Pupils are equal, round, and reactive to light. Right eye exhibits no discharge. Left eye exhibits no discharge. No scleral icterus.  Neck: Neck supple. No JVD present. No thyromegaly present.  Cardiovascular: Normal rate, regular rhythm and normal heart sounds.   No murmur heard. Pulmonary/Chest: Effort normal and breath sounds normal. No respiratory distress. She has no wheezes. She has no rales.  Abdominal: Soft. She exhibits no distension and no mass. There is no tenderness. There is no rebound and no guarding.  Genitourinary:  Pap done 2014. Bimanual normal.  Musculoskeletal: Normal range of motion. She exhibits no edema.  Lymphadenopathy:    She has no cervical adenopathy.  Neurological: She is alert and oriented to person, place, and time. She has normal reflexes. No cranial nerve deficit. Coordination normal.  Skin: Skin is warm and dry. No rash noted. She is not diaphoretic.  Psychiatric: She has a normal mood and affect. Her behavior is normal. Judgment and thought content normal.  Vitals reviewed.         Assessment & Plan:  Hyperlipidemia-restart Zocor and recheck lipid panel liver functions in 6 months  Vitamin D deficiency-take 2000 units vitamin D 3 daily  History of smoking-patient has been able to cut back substantially but has not quit  Health maintenance-needs mammogram and colonoscopy. Orders to be placed.  Plan: Return in 6 months. Will need office visit, lipid panel liver functions and vitamin D level.

## 2015-06-15 ENCOUNTER — Telehealth: Payer: Self-pay | Admitting: *Deleted

## 2015-06-15 NOTE — Telephone Encounter (Signed)
Reviewed lab results with patient and instructions. Patient states she doesn't feel she needs a dietician at this time. Patient has 6 month follow already booked.

## 2015-06-17 ENCOUNTER — Other Ambulatory Visit: Payer: Self-pay | Admitting: Internal Medicine

## 2015-06-18 ENCOUNTER — Encounter: Payer: Self-pay | Admitting: Internal Medicine

## 2015-06-25 ENCOUNTER — Encounter: Payer: Self-pay | Admitting: Internal Medicine

## 2015-06-25 ENCOUNTER — Ambulatory Visit (INDEPENDENT_AMBULATORY_CARE_PROVIDER_SITE_OTHER): Payer: Managed Care, Other (non HMO) | Admitting: Internal Medicine

## 2015-06-25 VITALS — BP 118/74 | HR 78 | Temp 98.2°F | Wt 174.0 lb

## 2015-06-25 DIAGNOSIS — N390 Urinary tract infection, site not specified: Secondary | ICD-10-CM

## 2015-06-25 DIAGNOSIS — R350 Frequency of micturition: Secondary | ICD-10-CM | POA: Diagnosis not present

## 2015-06-25 DIAGNOSIS — R8290 Unspecified abnormal findings in urine: Secondary | ICD-10-CM

## 2015-06-25 LAB — POCT URINALYSIS DIPSTICK
Bilirubin, UA: NEGATIVE
Blood, UA: NEGATIVE
GLUCOSE UA: NEGATIVE
Ketones, UA: NEGATIVE
Nitrite, UA: NEGATIVE
Protein, UA: NEGATIVE
SPEC GRAV UA: 1.025
Urobilinogen, UA: NEGATIVE
pH, UA: 8

## 2015-06-25 MED ORDER — CIPROFLOXACIN HCL 500 MG PO TABS: 500.0000 mg | ORAL_TABLET | Freq: Two times a day (BID) | ORAL | Status: DC

## 2015-06-25 NOTE — Progress Notes (Signed)
   Subjective:    Patient ID: Jamie Gilbert, female    DOB: October 20, 1955, 59 y.o.   MRN: [id number]  HPI She rode in the car for 8 and a half hours without stopping recently. Now has UTI symptoms with dysuria and urinary frequency with urgency. No fever or shaking chills. No recent UTI. She is menopausal. Leaving Wednesday to go out of town.    Review of Systems     Objective:   Physical Exam  No CVA tenderness. Urinalysis obtained. Dipstick shows LE. Culture pending      Assessment & Plan:  Acute UTI  Plan: Cipro 500 mg twice daily for 7 days

## 2015-06-25 NOTE — Patient Instructions (Signed)
Take AZO standard over-the-counter for dysuria. Cipro 500 mg twice daily for 7 days. Culture pending.

## 2015-06-28 ENCOUNTER — Telehealth: Payer: Self-pay | Admitting: *Deleted

## 2015-06-28 LAB — URINE CULTURE

## 2015-06-28 MED ORDER — AMOXICILLIN-POT CLAVULANATE 500-125 MG PO TABS: 1.0000 | ORAL_TABLET | Freq: Three times a day (TID) | ORAL | Status: DC

## 2015-06-28 NOTE — Telephone Encounter (Signed)
Reviewed urine culture results and instructions. New Rx sent to pharmacy. Patient will call back to schedule appt for repeat UA after completing antibiotic

## 2015-07-15 ENCOUNTER — Ambulatory Visit (INDEPENDENT_AMBULATORY_CARE_PROVIDER_SITE_OTHER): Payer: Managed Care, Other (non HMO) | Admitting: Internal Medicine

## 2015-07-15 DIAGNOSIS — R300 Dysuria: Secondary | ICD-10-CM | POA: Diagnosis not present

## 2015-07-15 LAB — POCT URINALYSIS DIPSTICK
Bilirubin, UA: NEGATIVE
Blood, UA: NEGATIVE
GLUCOSE UA: NEGATIVE
Ketones, UA: NEGATIVE
Nitrite, UA: NEGATIVE
Protein, UA: NEGATIVE
Spec Grav, UA: 1.01
UROBILINOGEN UA: 0.2
pH, UA: 6

## 2015-07-15 NOTE — Progress Notes (Signed)
   Subjective:    Patient ID: Jamie Gilbert, female    DOB: 1956-05-03, 59 y.o.   MRN: [id number]  HPI Here for nurse visit status post treatment for UTI. Initially treated with Cipro but had multidrug resistant organism and was subsequently treated with Augmentin. Urinalysis today is not completely clear by dipstick. LE still present. Urine will be recultured and await culture results.    Review of Systems     Objective:   Physical Exam See  urine dipstick results       Assessment & Plan:  Possible persistent UTI. Patient asymptomatic.  Plan: Await urine culture results.

## 2015-07-15 NOTE — Addendum Note (Signed)
Addended by: Beryle Quant on: 07/15/2015 12:22 PM   Modules accepted: Orders

## 2015-07-16 LAB — CULTURE, URINE COMPREHENSIVE

## 2015-11-22 ENCOUNTER — Other Ambulatory Visit: Payer: Managed Care, Other (non HMO) | Admitting: Internal Medicine

## 2015-11-22 DIAGNOSIS — R7302 Impaired glucose tolerance (oral): Secondary | ICD-10-CM

## 2015-11-22 DIAGNOSIS — E785 Hyperlipidemia, unspecified: Secondary | ICD-10-CM

## 2015-11-22 DIAGNOSIS — Z79899 Other long term (current) drug therapy: Secondary | ICD-10-CM

## 2015-11-22 LAB — HEMOGLOBIN A1C
Hgb A1c MFr Bld: 5.7 % — ABNORMAL HIGH (ref <5.7)
Mean Plasma Glucose: 117 mg/dL — ABNORMAL HIGH (ref <117)

## 2015-11-22 LAB — LIPID PANEL
Cholesterol: 264 mg/dL — ABNORMAL HIGH (ref 125–200)
HDL: 72 mg/dL (ref >=46)
LDL Cholesterol: 181 mg/dL — ABNORMAL HIGH (ref <130)
TRIGLYCERIDES: 57 mg/dL (ref <150)
Total CHOL/HDL Ratio: 3.7 Ratio (ref <=5.0)
VLDL: 11 mg/dL (ref <30)

## 2015-11-22 LAB — HEPATIC FUNCTION PANEL
ALT: 16 U/L (ref 6–29)
AST: 17 U/L (ref 10–35)
Albumin: 4.4 g/dL (ref 3.6–5.1)
Alkaline Phosphatase: 54 U/L (ref 33–130)
BILIRUBIN DIRECT: 0.1 mg/dL (ref <=0.2)
Indirect Bilirubin: 0.3 mg/dL (ref 0.2–1.2)
Total Bilirubin: 0.4 mg/dL (ref 0.2–1.2)
Total Protein: 6.9 g/dL (ref 6.1–8.1)

## 2015-11-23 ENCOUNTER — Ambulatory Visit (INDEPENDENT_AMBULATORY_CARE_PROVIDER_SITE_OTHER): Payer: Managed Care, Other (non HMO) | Admitting: Internal Medicine

## 2015-11-23 ENCOUNTER — Encounter: Payer: Self-pay | Admitting: Internal Medicine

## 2015-11-23 VITALS — BP 132/78 | HR 74 | Temp 97.5°F | Ht 70.0 in | Wt 178.5 lb

## 2015-11-23 DIAGNOSIS — R7302 Impaired glucose tolerance (oral): Secondary | ICD-10-CM

## 2015-11-23 DIAGNOSIS — Z720 Tobacco use: Secondary | ICD-10-CM

## 2015-11-23 DIAGNOSIS — F17200 Nicotine dependence, unspecified, uncomplicated: Secondary | ICD-10-CM

## 2015-11-23 DIAGNOSIS — E785 Hyperlipidemia, unspecified: Secondary | ICD-10-CM

## 2015-11-23 NOTE — Progress Notes (Signed)
   Subjective:    Patient ID: Jamie Gilbert, female    DOB: 09/01/1956, 60 y.o.   MRN: [id number]  HPI 60 year old Female with history of hyperlipidemia. History of smoking and continues. 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 Taking Zocor 40 mg daily with improvement in lipids but still elevated. Walks dog 2 miles a day. Was started on Zocor 40 mg daily September 2016 at which time total cholesterol was 294 with an LDL of 264. Now total cholesterol was 204 with an LDL of 181. No adverse effects of Zocor.    Review of Systems some recent sinus congestion and ear congestion. Thinks it is allergies.     Objective:   Physical Exam  HENT:  Right Ear: External ear normal.  Left Ear: External ear normal.  Neck: Neck supple. No JVD present. No thyromegaly present.  Cardiovascular: Normal rate, regular rhythm and normal heart sounds.   No murmur heard. Pulmonary/Chest: Effort normal and breath sounds normal. No respiratory distress. She has no wheezes. She has no rales.  Lymphadenopathy:    She has no cervical adenopathy.  Skin: Skin is warm and dry.  Vitals reviewed.         Assessment & Plan:  Impaired glucose tolerance-Hemoglobin A1c 5.7%. Continue with diet and exercise.  Hyperlipidemia-Currently on Zocor 40 mg daily. Improvement noted from total cholesterol of 294 to 264 and LDL cholesterol improved from 204 to 181  Smoking-Refuses to quit  Plan: Continue Zocor 40 mg daily, watch diet, and RTC in 6 months for physical examination.

## 2015-11-23 NOTE — Patient Instructions (Signed)
Continue Zocor and RTC  in 6 months for CPE. Watch diet and exercise. Continue Zocor.

## 2016-03-08 DIAGNOSIS — Z029 Encounter for administrative examinations, unspecified: Secondary | ICD-10-CM

## 2016-06-06 ENCOUNTER — Other Ambulatory Visit: Payer: Managed Care, Other (non HMO) | Admitting: Internal Medicine

## 2016-06-06 DIAGNOSIS — Z0000 Encounter for general adult medical examination without abnormal findings: Secondary | ICD-10-CM

## 2016-06-06 LAB — TSH: TSH: 2.1 mIU/L

## 2016-06-06 LAB — CBC WITH DIFFERENTIAL/PLATELET
Basophils Absolute: 54 cells/uL (ref 0–200)
Basophils Relative: 1 %
EOS PCT: 1 %
Eosinophils Absolute: 54 cells/uL (ref 15–500)
HCT: 42.5 % (ref 35.0–45.0)
Hemoglobin: 14.2 g/dL (ref 11.7–15.5)
LYMPHS PCT: 34 %
Lymphs Abs: 1836 cells/uL (ref 850–3900)
MCH: 32.9 pg (ref 27.0–33.0)
MCHC: 33.4 g/dL (ref 32.0–36.0)
MCV: 98.4 fL (ref 80.0–100.0)
MPV: 9.4 fL (ref 7.5–12.5)
Monocytes Absolute: 378 cells/uL (ref 200–950)
Monocytes Relative: 7 %
NEUTROS PCT: 57 %
Neutro Abs: 3078 cells/uL (ref 1500–7800)
Platelets: 338 10*3/uL (ref 140–400)
RBC: 4.32 MIL/uL (ref 3.80–5.10)
RDW: 14.6 % (ref 11.0–15.0)
WBC: 5.4 10*3/uL (ref 3.8–10.8)

## 2016-06-06 LAB — COMPREHENSIVE METABOLIC PANEL
ALT: 10 U/L (ref 6–29)
AST: 15 U/L (ref 10–35)
Albumin: 4.3 g/dL (ref 3.6–5.1)
Alkaline Phosphatase: 50 U/L (ref 33–130)
BILIRUBIN TOTAL: 0.4 mg/dL (ref 0.2–1.2)
BUN: 12 mg/dL (ref 7–25)
CO2: 25 mmol/L (ref 20–31)
CREATININE: 0.8 mg/dL (ref 0.50–0.99)
Calcium: 9.2 mg/dL (ref 8.6–10.4)
Chloride: 106 mmol/L (ref 98–110)
Glucose, Bld: 89 mg/dL (ref 65–99)
Potassium: 4.8 mmol/L (ref 3.5–5.3)
SODIUM: 139 mmol/L (ref 135–146)
Total Protein: 6.8 g/dL (ref 6.1–8.1)

## 2016-06-06 LAB — LIPID PANEL
CHOLESTEROL: 277 mg/dL — AB (ref 125–200)
HDL: 69 mg/dL (ref >=46)
LDL CALC: 193 mg/dL — AB (ref <130)
Total CHOL/HDL Ratio: 4 Ratio (ref <=5.0)
Triglycerides: 77 mg/dL (ref <150)
VLDL: 15 mg/dL (ref <30)

## 2016-06-07 ENCOUNTER — Other Ambulatory Visit: Payer: Self-pay | Admitting: Internal Medicine

## 2016-06-07 DIAGNOSIS — Z1231 Encounter for screening mammogram for malignant neoplasm of breast: Secondary | ICD-10-CM

## 2016-06-07 LAB — VITAMIN D 25 HYDROXY (VIT D DEFICIENCY, FRACTURES): Vit D, 25-Hydroxy: 17 ng/mL — ABNORMAL LOW (ref 30–100)

## 2016-06-08 ENCOUNTER — Ambulatory Visit
Admission: RE | Admit: 2016-06-08 | Discharge: 2016-06-08 | Disposition: A | Discharge status: Home / Self Care | Payer: Managed Care, Other (non HMO) | Source: Ambulatory Visit | Attending: Internal Medicine | Admitting: Internal Medicine

## 2016-06-08 ENCOUNTER — Encounter: Payer: Managed Care, Other (non HMO) | Admitting: Internal Medicine

## 2016-06-08 DIAGNOSIS — Z1231 Encounter for screening mammogram for malignant neoplasm of breast: Secondary | ICD-10-CM

## 2016-06-13 ENCOUNTER — Other Ambulatory Visit: Payer: Self-pay | Admitting: Internal Medicine

## 2016-06-13 DIAGNOSIS — R928 Other abnormal and inconclusive findings on diagnostic imaging of breast: Secondary | ICD-10-CM

## 2016-06-15 ENCOUNTER — Encounter: Payer: Self-pay | Admitting: Internal Medicine

## 2016-06-15 ENCOUNTER — Ambulatory Visit (INDEPENDENT_AMBULATORY_CARE_PROVIDER_SITE_OTHER): Payer: Managed Care, Other (non HMO) | Admitting: Internal Medicine

## 2016-06-15 ENCOUNTER — Other Ambulatory Visit (HOSPITAL_COMMUNITY)
Admission: RE | Admit: 2016-06-15 | Discharge: 2016-06-15 | Disposition: A | Discharge status: Home / Self Care | Payer: Managed Care, Other (non HMO) | Source: Ambulatory Visit | Attending: Internal Medicine | Admitting: Internal Medicine

## 2016-06-15 VITALS — BP 132/88 | HR 83 | Temp 97.6°F | Ht 70.5 in | Wt 177.5 lb

## 2016-06-15 DIAGNOSIS — Z01419 Encounter for gynecological examination (general) (routine) without abnormal findings: Secondary | ICD-10-CM | POA: Insufficient documentation

## 2016-06-15 DIAGNOSIS — Z0000 Encounter for general adult medical examination without abnormal findings: Secondary | ICD-10-CM | POA: Diagnosis not present

## 2016-06-15 DIAGNOSIS — F17200 Nicotine dependence, unspecified, uncomplicated: Secondary | ICD-10-CM

## 2016-06-15 DIAGNOSIS — E785 Hyperlipidemia, unspecified: Secondary | ICD-10-CM | POA: Diagnosis not present

## 2016-06-15 DIAGNOSIS — E559 Vitamin D deficiency, unspecified: Secondary | ICD-10-CM

## 2016-06-15 DIAGNOSIS — Z720 Tobacco use: Secondary | ICD-10-CM

## 2016-06-15 LAB — POCT URINALYSIS DIPSTICK
Bilirubin, UA: NEGATIVE
Blood, UA: NEGATIVE
Glucose, UA: NEGATIVE
KETONES UA: NEGATIVE
Leukocytes, UA: NEGATIVE
Nitrite, UA: NEGATIVE
PROTEIN UA: NEGATIVE
SPEC GRAV UA: 1.01
Urobilinogen, UA: NEGATIVE
pH, UA: 7

## 2016-06-15 MED ORDER — ERGOCALCIFEROL 1.25 MG (50000 UT) PO CAPS: 50000.0000 [IU] | ORAL_CAPSULE | ORAL | 2 refills | Status: DC

## 2016-06-15 MED ORDER — CIPROFLOXACIN HCL 500 MG PO TABS: 500.0000 mg | ORAL_TABLET | Freq: Two times a day (BID) | ORAL | 0 refills | Status: DC

## 2016-06-15 MED ORDER — ROSUVASTATIN CALCIUM 5 MG PO TABS: 5.0000 mg | ORAL_TABLET | Freq: Every day | ORAL | 1 refills | Status: DC

## 2016-06-15 NOTE — Patient Instructions (Signed)
Drisdol 50,000 units weekly for 12 weeks followed by 2000 units daily vitamin D3. Follow-up in 3 months. Crestor 5 mg daily and follow-up in 3 months. Declined flu vaccine.

## 2016-06-15 NOTE — Progress Notes (Signed)
Subjective:    Patient ID: Jamie Gilbert, female    DOB: [birth year], 60 y.o.   MRN: [id number]  HPI 60 year old White Female for health maintenance and evaluation if medical issues.  Review of lab work revealed significant hyperlipidemia once again. She is willing now to try Crestor 5 mg daily and follow-up in 3 months.  Doesn't take vitamin D supplementation regularly. Vitamin D level was 17. Recommend Drisdol 50,000 units weekly for 12 weeks and 2000 units daily and follow-up in 3 months.  Continues to smoke. Smoking the same amount. Not willing to quit.  Declining flu vaccine.  Continues with a fair amount of international travel with her husband who continues to work. She is asking for Cipro. Prescribed 500 mg twice daily for 10 days.  History of right lower lobe pneumonia January 2000, infectious colitis February 2000, ectopic pregnancy and bilateral tubal ligation 1990. Torn meniscus of left knee with plica being excised 0000000. Subsequently developed regional pain syndrome post surgery. History of abnormal mammogram 2011 with benign breast biopsy. In 2011 she was started on Zocor but did not continue it. History of hyperlipidemia long-standing.  Smoking a couple of pack of cigarettes per week. Social alcohol consumption.  Social history: She has been on a farm in [place] in [place]. 2 female children in good health adult's one of whom lives in [place]. No children from second marriage. This is her second marriage. She travels extensively with her husband.  Family history: Mother died of abdominal aortic aneurysm at age 55. She had history of hypertension and was a former smoker. Father died at age 62 with brain tumor. 3 brothers in their 66s in good health with the exception of hyperlipidemia. No sisters.       Review of Systems  Constitutional: Negative.   All other systems reviewed and are negative.  really     Objective:   Physical Exam  Constitutional: She  is oriented to person, place, and time. She appears well-developed and well-nourished. No distress.  HENT:  Head: Normocephalic and atraumatic.  Right Ear: External ear normal.  Left Ear: External ear normal.  Mouth/Throat: Oropharynx is clear and moist.  Eyes: Conjunctivae are normal. Pupils are equal, round, and reactive to light. Right eye exhibits no discharge. Left eye exhibits no discharge.  Neck: Neck supple. No JVD present. No thyromegaly present.  Cardiovascular: Normal rate, regular rhythm and normal heart sounds.   No murmur heard. Pulmonary/Chest: Effort normal and breath sounds normal. No respiratory distress. She has no wheezes. She exhibits no tenderness.  Abdominal: Soft. Bowel sounds are normal. She exhibits no distension and no mass. There is no tenderness. There is no rebound and no guarding.  Genitourinary:  Genitourinary Comments: Pap taken. Cervix retroverted  Musculoskeletal: She exhibits no edema.  Lymphadenopathy:    She has no cervical adenopathy.  Neurological: She is alert and oriented to person, place, and time. She has normal reflexes. No cranial nerve deficit. Coordination normal.  Skin: Skin is warm and dry. No rash noted. She is not diaphoretic.  Psychiatric: She has a normal mood and affect. Her behavior is normal. Judgment and thought content normal.  Vitals reviewed.         Assessment & Plan:  Vitamin D deficiency-Drisdol 50,000 units weekly for 12 weeks and 2000 units daily-recheck level in 3 months  Hyperlipidemia-start Crestor 5 mg daily return in 3 months for office visit, lipid panel, liver functions.  Smoking-noninjected in quitting  Obesity-discussed diet  and exercise and weight loss efforts  Traveling counter-prescribe Cipro 500 mg twice daily for 10 days to have for international travel  Healthcare maintenance-declines flu vaccine

## 2016-06-19 LAB — CYTOLOGY - PAP

## 2016-08-14 ENCOUNTER — Ambulatory Visit
Admission: RE | Admit: 2016-08-14 | Discharge: 2016-08-14 | Disposition: A | Discharge status: Home / Self Care | Payer: Managed Care, Other (non HMO) | Source: Ambulatory Visit | Attending: Internal Medicine | Admitting: Internal Medicine

## 2016-08-14 ENCOUNTER — Other Ambulatory Visit: Payer: Self-pay | Admitting: Internal Medicine

## 2016-08-14 DIAGNOSIS — R928 Other abnormal and inconclusive findings on diagnostic imaging of breast: Secondary | ICD-10-CM

## 2016-08-14 DIAGNOSIS — R921 Mammographic calcification found on diagnostic imaging of breast: Secondary | ICD-10-CM

## 2016-08-17 ENCOUNTER — Ambulatory Visit
Admission: RE | Admit: 2016-08-17 | Discharge: 2016-08-17 | Disposition: A | Discharge status: Home / Self Care | Payer: Managed Care, Other (non HMO) | Source: Ambulatory Visit | Attending: Internal Medicine | Admitting: Internal Medicine

## 2016-08-17 DIAGNOSIS — R921 Mammographic calcification found on diagnostic imaging of breast: Secondary | ICD-10-CM

## 2016-08-21 ENCOUNTER — Encounter: Payer: Self-pay | Admitting: Internal Medicine

## 2016-08-21 ENCOUNTER — Ambulatory Visit (INDEPENDENT_AMBULATORY_CARE_PROVIDER_SITE_OTHER): Payer: Managed Care, Other (non HMO) | Admitting: Internal Medicine

## 2016-08-21 VITALS — BP 130/84 | HR 86 | Temp 98.4°F | Ht 71.0 in | Wt 180.0 lb

## 2016-08-21 DIAGNOSIS — D0512 Intraductal carcinoma in situ of left breast: Secondary | ICD-10-CM | POA: Diagnosis not present

## 2016-08-21 DIAGNOSIS — E785 Hyperlipidemia, unspecified: Secondary | ICD-10-CM

## 2016-08-21 DIAGNOSIS — Z87891 Personal history of nicotine dependence: Secondary | ICD-10-CM

## 2016-08-23 ENCOUNTER — Encounter: Payer: Self-pay | Admitting: Genetic Counselor

## 2016-08-24 ENCOUNTER — Telehealth: Payer: Self-pay | Admitting: Oncology

## 2016-08-24 NOTE — Telephone Encounter (Signed)
Appt has been scheduled w/[facility] on 12/19 at 4pm. Pt aware to arrive 30 minutes early. Demographics has been verified.

## 2016-08-25 ENCOUNTER — Encounter: Payer: Self-pay | Admitting: Radiation Oncology

## 2016-08-28 ENCOUNTER — Encounter: Payer: Self-pay | Admitting: Oncology

## 2016-08-31 ENCOUNTER — Other Ambulatory Visit: Payer: Self-pay | Admitting: Oncology

## 2016-09-04 ENCOUNTER — Other Ambulatory Visit: Payer: Self-pay | Admitting: *Deleted

## 2016-09-04 DIAGNOSIS — C50919 Malignant neoplasm of unspecified site of unspecified female breast: Secondary | ICD-10-CM

## 2016-09-05 ENCOUNTER — Other Ambulatory Visit (HOSPITAL_BASED_OUTPATIENT_CLINIC_OR_DEPARTMENT_OTHER): Payer: Managed Care, Other (non HMO)

## 2016-09-05 ENCOUNTER — Ambulatory Visit (HOSPITAL_BASED_OUTPATIENT_CLINIC_OR_DEPARTMENT_OTHER): Payer: Managed Care, Other (non HMO) | Admitting: Oncology

## 2016-09-05 DIAGNOSIS — Z720 Tobacco use: Secondary | ICD-10-CM

## 2016-09-05 DIAGNOSIS — D0512 Intraductal carcinoma in situ of left breast: Secondary | ICD-10-CM

## 2016-09-05 DIAGNOSIS — C50919 Malignant neoplasm of unspecified site of unspecified female breast: Secondary | ICD-10-CM

## 2016-09-05 DIAGNOSIS — Z170 Estrogen receptor positive status [ER+]: Secondary | ICD-10-CM | POA: Diagnosis not present

## 2016-09-05 LAB — CBC WITH DIFFERENTIAL/PLATELET
BASO%: 1 % (ref 0.0–2.0)
Basophils Absolute: 0.1 10*3/uL (ref 0.0–0.1)
EOS ABS: 0 10*3/uL (ref 0.0–0.5)
EOS%: 0.2 % (ref 0.0–7.0)
HEMATOCRIT: 39.2 % (ref 34.8–46.6)
HGB: 13 g/dL (ref 11.6–15.9)
LYMPH#: 1.5 10*3/uL (ref 0.9–3.3)
LYMPH%: 23.2 % (ref 14.0–49.7)
MCH: 32.3 pg (ref 25.1–34.0)
MCHC: 33.1 g/dL (ref 31.5–36.0)
MCV: 97.7 fL (ref 79.5–101.0)
MONO#: 0.4 10*3/uL (ref 0.1–0.9)
MONO%: 5.7 % (ref 0.0–14.0)
NEUT%: 69.9 % (ref 38.4–76.8)
NEUTROS ABS: 4.6 10*3/uL (ref 1.5–6.5)
PLATELETS: 327 10*3/uL (ref 145–400)
RBC: 4.01 10*6/uL (ref 3.70–5.45)
RDW: 12.9 % (ref 11.2–14.5)
WBC: 6.6 10*3/uL (ref 3.9–10.3)

## 2016-09-05 LAB — COMPREHENSIVE METABOLIC PANEL
ALBUMIN: 3.9 g/dL (ref 3.5–5.0)
ALK PHOS: 60 U/L (ref 40–150)
ALT: 16 U/L (ref 0–55)
ANION GAP: 7 meq/L (ref 3–11)
AST: 18 U/L (ref 5–34)
BUN: 11.3 mg/dL (ref 7.0–26.0)
CALCIUM: 9.4 mg/dL (ref 8.4–10.4)
CHLORIDE: 106 meq/L (ref 98–109)
CO2: 26 mEq/L (ref 22–29)
CREATININE: 1.1 mg/dL (ref 0.6–1.1)
EGFR: 55 mL/min/{1.73_m2} — ABNORMAL LOW (ref >90)
Glucose: 102 mg/dl (ref 70–140)
Potassium: 3.8 mEq/L (ref 3.5–5.1)
Sodium: 140 mEq/L (ref 136–145)
Total Protein: 7.2 g/dL (ref 6.4–8.3)

## 2016-09-05 LAB — DRAW EXTRA CLOT TUBE

## 2016-09-05 NOTE — Progress Notes (Signed)
[facility]  [phone] [phone]     ID: Jamie Gilbert DOB: 1956/07/13  MR#: [id number]  [id number]  Patient Care Team: Elby Showers, MD as PCP - General (Internal Medicine) [name], MD as Consulting Physician (Oncology) [name], MD as Consulting Physician (General Surgery) [name], MD as Consulting Physician (Radiation Oncology) [name], MD OTHER MD:  CHIEF COMPLAINT: Ductal carcinoma in situ  CURRENT TREATMENT: Awaiting definitive surgery.   BREAST CANCER HISTORY: The patient had bilateral screening mammography with tomography at the [facility] 06/08/2016. There were calcifications in the left breast which were further evaluated 08/14/2016 with left unilateral diagnostic mammography. The breast density was category B. There was a developing group of heterogeneous calcifications in the left breast lateral to the nipple at the 2:00 position spanning 2 cm. 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  The patient's subsequent history is as detailed below  INTERVAL HISTORY: Jamie Gilbert was evaluated in the breast clinic 09/05/2016 accompanied by her daughter Jamie Gilbert. Her case was also presented in the multidisciplinary breast cancer conference 08/23/2016. At that time a preliminary plan was proposed: Breast conserving surgery, consideration of the COMET trial  REVIEW OF SYSTEMS: There were no specific symptoms leading to the original mammogram, which was routinely scheduled. The patient denies unusual headaches, visual changes, nausea, vomiting, stiff neck, dizziness, or gait imbalance. There has been no cough, phlegm production, or pleurisy, no chest pain or pressure, and no change in bowel or bladder habits. The patient denies fever, rash, bleeding,  unexplained fatigue or unexplained weight loss. A detailed review of systems was otherwise entirely negative.   PAST MEDICAL HISTORY: Past Medical History:  Diagnosis Date  . Colitis, infectious   . Hyperlipidemia   . Vitamin D deficiency     PAST SURGICAL HISTORY: Past Surgical History:  Procedure Laterality Date  . ECTOPIC PREGNANCY SURGERY    . medial meniscus repair     left knee w/plica excised  . TUBAL LIGATION      FAMILY HISTORY Family History  Problem Relation Age of Onset  . Hypertension Mother   . Aortic aneurysm Mother   . Brain cancer Father   The patient's father died from a brain tumor at age 76. The patient's mother died from a ruptured aortic aneurysm at age 80. The patient has 3 brothers, no sisters. There is no history of breast or ovarian cancer in the family to her knowledge  GYNECOLOGIC HISTORY:  No LMP recorded. Patient is postmenopausal. Menarche age 68, first live birth age 18. She is GX P2. She went through the change of life in 2008. She did not use hormone replacement. She never used oral contraceptives.  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 Her husband Jamie Gilbert is an Chief Financial Officer. He does a lot of traveling. Daughter Jamie Gilbert lives in [place] currently unemployed but had been working in Scientist, research (medical). Daughter Jamie Gilbert lives in [place] where she is a Agricultural engineer. The patient has no grandchildren. She is not a church attender    ADVANCED DIRECTIVES: The patient has a living will in place. Her husband is her healthcare power of attorney   HEALTH MAINTENANCE: Social History  Substance Use Topics  . Smoking status: Current Some Day Smoker    Years: 25.00    Types: Cigarettes, E-cigarettes  . Smokeless tobacco: Current User  .  Alcohol use 8.4 oz/week    14 Standard drinks or equivalent per week     Colonoscopy: More than 10 years ago  PAP:  Bone density: 2012(?)--Osteopenia per patient report   Allergies    Allergen Reactions  . Eggs Or Egg-Derived Products Nausea And Vomiting    Current Outpatient Prescriptions  Medication Sig Dispense Refill  . calcium-vitamin D (OSCAL WITH D) 500-200 MG-UNIT per tablet Take 1 tablet by mouth daily.      . ergocalciferol (VITAMIN D2) 50000 units capsule Take 1 capsule (50,000 Units total) by mouth once a week. 4 capsule 2  . ibuprofen (ADVIL,MOTRIN) 800 MG tablet TAKE (1) TABLET EVERY EIGHT HOURS AS NEEDED FOR PAIN. (Patient not taking: Reported on 08/21/2016) 90 tablet 3  . rosuvastatin (CRESTOR) 5 MG tablet Take 1 tablet (5 mg total) by mouth daily. 90 tablet 1   No current facility-administered medications for this visit.     OBJECTIVE: Middle-aged white woman in no acute distress  Vitals:   09/05/16 1553  BP: 122/76  Pulse: 84  Resp: 18  Temp: 98.4 F (36.9 C)     Body mass index is 24.73 kg/m.    ECOG FS:0 - Asymptomatic  Ocular: Sclerae unicteric, pupils equal, round and reactive to light Ear-nose-throat: Oropharynx clear and moist Lymphatic: No cervical or supraclavicular adenopathy Lungs no rales or rhonchi, good excursion bilaterally Heart regular rate and rhythm, no murmur appreciated Abd soft, nontender, positive bowel sounds MSK no focal spinal tenderness, no joint edema Neuro: non-focal, well-oriented, appropriate affect Breasts: The right breast is unremarkable. The left breast is status post recent biopsy. I do not palpate a mass. There are no skin or nipple changes of concern. Left axilla is benign.   LAB RESULTS:  CMP     Component Value Date/Time   NA 140 09/05/2016 1540   K 3.8 09/05/2016 1540   CL 106 06/06/2016 1033   CO2 26 09/05/2016 1540   GLUCOSE 102 09/05/2016 1540   BUN 11.3 09/05/2016 1540   CREATININE 1.1 09/05/2016 1540   CALCIUM 9.4 09/05/2016 1540   PROT 7.2 09/05/2016 1540   ALBUMIN 3.9 09/05/2016 1540   AST 18 09/05/2016 1540   ALT 16 09/05/2016 1540   ALKPHOS 60 09/05/2016 1540   BILITOT <0.22  09/05/2016 1540   GFRNONAA 85 06/10/2015 0916   GFRAA >89 06/10/2015 0916    INo results found for: SPEP, UPEP  Lab Results  Component Value Date   WBC 6.6 09/05/2016   NEUTROABS 4.6 09/05/2016   HGB 13.0 09/05/2016   HCT 39.2 09/05/2016   MCV 97.7 09/05/2016   PLT 327 09/05/2016      Chemistry      Component Value Date/Time   NA 140 09/05/2016 1540   K 3.8 09/05/2016 1540   CL 106 06/06/2016 1033   CO2 26 09/05/2016 1540   BUN 11.3 09/05/2016 1540   CREATININE 1.1 09/05/2016 1540      Component Value Date/Time   CALCIUM 9.4 09/05/2016 1540   ALKPHOS 60 09/05/2016 1540   AST 18 09/05/2016 1540   ALT 16 09/05/2016 1540   BILITOT <0.22 09/05/2016 1540       No results found for: LABCA2  No components found for: LABCA125  No results for input(s): INR in the last 168 hours.  Urinalysis    Component Value Date/Time   BILIRUBINUR neg 06/15/2016 1029   PROTEINUR neg 06/15/2016 1029   UROBILINOGEN negative 06/15/2016 1029   NITRITE neg  06/15/2016 1029   LEUKOCYTESUR Negative 06/15/2016 1029     STUDIES: Mm Digital Diagnostic Unilat L  Result Date: 08/14/2016 CLINICAL DATA:  Recall from screening mammography. EXAM: DIGITAL DIAGNOSTIC LEFT MAMMOGRAM WITH CAD COMPARISON:  Previous exam(s). ACR Breast Density Category b: There are scattered areas of fibroglandular density. FINDINGS: Magnification views of the left breast demonstrate a developing group of heterogeneous calcifications to be present located lateral to the left nipple at the 2 o'clock position within the anterior portion of the breast spanning 2 cm. Tissue sampling is recommended. I have discussed stereotactic biopsy of the calcifications with the patient. This will be scheduled. Mammographic images were processed with CAD. IMPRESSION: 2 cm group of heterogeneous calcifications located within the left breast at 2 o'clock position. Stereotactic biopsy is recommended. This will be scheduled. RECOMMENDATION:  Left breast stereotactic biopsy. I have discussed the findings and recommendations with the patient. Results were also provided in writing at the conclusion of the visit. If applicable, a reminder letter will be sent to the patient regarding the next appointment. BI-RADS CATEGORY  4: Suspicious. Electronically Signed   By: Altamese Cabal M.D.   On: 08/14/2016 09:18   Mm Clip Placement Left  Result Date: 08/17/2016 CLINICAL DATA:  Status post stereotactic guided core needle biopsy of a 2 cm group of microcalcifications in the 2-3 o'clock position of the left breast. EXAM: DIAGNOSTIC LEFT MAMMOGRAM POST STEREOTACTIC BIOPSY COMPARISON:  Previous exam(s). FINDINGS: Mammographic images were obtained following stereotactic guided biopsy of the recently demonstrated 2 cm group of microcalcifications in the 2 to 3 o'clock position of the left breast. These demonstrate a ribbon shaped biopsy marker clip at the location of the biopsied calcifications. There is a small post biopsy hematoma at that location. Multiple calcifications are absent following biopsy. IMPRESSION: Appropriate clip deployment following left breast stereotactic guided core needle biopsy. Final Assessment: Post Procedure Mammograms for Marker Placement Electronically Signed   By: Claudie Revering M.D.   On: 08/17/2016 11:08   Mm Lt Breast Bx W Loc Dev 1st Lesion Image Bx Spec Stereo Guide  Addendum Date: 08/19/2016   ADDENDUM REPORT: 08/18/2016 14:30 ADDENDUM: Pathology revealed INTERMEDIATE GRADE DUCTAL CARCINOMA IN SITU WITH CALCIFICATIONS of the Left breast, 2-3 o'clock. This was found to be concordant by Dr. Claudie Revering. Pathology results were discussed with the patient by telephone. The patient reported doing well after the biopsy with tenderness at the site. Post biopsy instructions and care were reviewed and questions were answered. The patient was encouraged to call [facility] for any additional concerns.  Surgical consultation has been arranged with Dr. [name] at [facility] on August 24, 2016. Pathology results reported by Terie Purser, RN on 08/18/2016. Electronically Signed   By: Claudie Revering M.D.   On: 08/18/2016 14:30   Result Date: 08/19/2016 CLINICAL DATA:  2 cm group of calcifications in the outer left breast in the 2-3 o'clock position at recent mammography. EXAM: LEFT BREAST STEREOTACTIC CORE NEEDLE BIOPSY COMPARISON:  Previous exams. FINDINGS: The patient and I discussed the procedure of stereotactic-guided biopsy including benefits and alternatives. We discussed the high likelihood of a successful procedure. We discussed the risks of the procedure including infection, bleeding, tissue injury, clip migration, and inadequate sampling. Informed written consent was given. The usual time out protocol was performed immediately prior to the procedure. Using sterile technique and 1% and 2% Lidocaine as local anesthetic, under stereotactic guidance, a 9 gauge vacuum assisted device  was used to perform core needle biopsy of the recently demonstrated 2 cm group of microcalcifications in the anterior aspect of the left breast in the 2-3 o'clock position using a lateral approach. Specimen radiograph was performed showing multiple calcifications within multiple specimens. Specimens with calcifications are identified for pathology. At the conclusion of the procedure, a ribbon shaped tissue marker clip was deployed into the biopsy cavity. Follow-up 2-view mammogram was performed and dictated separately. IMPRESSION: Stereotactic-guided biopsy of a 2 cm group of microcalcifications in the 2-3 o'clock position of the left breast. No apparent complications. Electronically Signed: By: Claudie Revering M.D. On: 08/17/2016 10:42    ELIGIBLE FOR AVAILABLE RESEARCH PROTOCOL: decided against COMET trial  ASSESSMENT: 60 y.o. [place] woman status post left breast biopsy 08/17/2016 for ductal  carcinoma in situ, grade 2, estrogen and progesterone receptor positive.    (1) definitive surgery pending  (a) the patient has been strongly advised to discontinue smoking at least 2 weeks preop and 2 weeks postop   (2) adjuvant radiation to follow  (3) consider anti-estrogens at the completion of local therapy   PLAN: We spent the better part of today's hour-long appointment discussing the biology of breast cancer in general, and the specifics of the patient's tumor in particular.  Kaydan understands that in noninvasive ductal carcinoma, also called ductal carcinoma in situ ("DCIS") the breast cancer cells remain trapped in the ducts were they started. They cannot travel to a vital organ. For that reason these cancers in themselves are not life-threatening.  If the whole breast is removed then all the ducts are removed and since the cancer cells are trapped in the ducts, the cure rate with mastectomy for noninvasive breast cancer is approximately 99%. Nevertheless we recommend lumpectomy, because there is no survival advantage to mastectomy and because the cosmetic result is generally superior with breast conservation.  Since the patient is keeping her breasts, there will be some risk of recurrence. The recurrence can only be in the same breast since, again, the cells are trapped in the ducts. There is no connection from one breast to the other. The risk of local recurrence is cut by more than half with radiation, which is standard in this situation.  In estrogen receptor positive cancers like Jamie Gilbert, anti-estrogens can also be considered. They will further reduce the risk of recurrence by one half. In addition anti-estrogens will lower the risk of a new breast cancer developing in either breast, also by one half. That risk approaches 1% per year. Anti-estrogens reduce it to 1/2%.  Accordingly the overall plan is for surgery, followed by radiation, then further discussion of anti-estrogens:  We did discuss the possible toxicities side effects and complications today as well as the possible benefits  I explained to [facility] that it will be very important for her healing to have planning of oxygen and therefore she should optimally quit smoking at least 2 weeks preop and then not pick up the habit again until 2 weeks postop. She did not commit 1 where the other in that regard  She tells me she and her husband will be in [place] for part of March. She should be done with radiation around that time. I'll plan to see her again in April 2018. We can make a definitive decision regarding anti-estrogens that.  The patient has a good understanding of the overall plan. She agrees with it. She knows the goal of treatment in her case is cure. She will call  with any problems that may develop before her next visit here.  [name], MD   09/05/2016 4:55 PM Medical Oncology and Hematology [facility] [address] [place], [place] [address] Tel. [phone]    Fax. [phone]

## 2016-09-06 ENCOUNTER — Ambulatory Visit
Admission: RE | Admit: 2016-09-06 | Discharge: 2016-09-06 | Disposition: A | Discharge status: Home / Self Care | Payer: Managed Care, Other (non HMO) | Source: Ambulatory Visit | Attending: Radiation Oncology | Admitting: Radiation Oncology

## 2016-09-06 ENCOUNTER — Encounter: Payer: Self-pay | Admitting: Radiation Oncology

## 2016-09-06 ENCOUNTER — Encounter: Payer: Self-pay | Admitting: *Deleted

## 2016-09-06 VITALS — BP 129/75 | HR 78 | Temp 99.4°F | Resp 16 | Ht 70.0 in | Wt 176.2 lb

## 2016-09-06 DIAGNOSIS — Z510 Encounter for antineoplastic radiation therapy: Secondary | ICD-10-CM | POA: Insufficient documentation

## 2016-09-06 DIAGNOSIS — Z79899 Other long term (current) drug therapy: Secondary | ICD-10-CM | POA: Insufficient documentation

## 2016-09-06 DIAGNOSIS — D0512 Intraductal carcinoma in situ of left breast: Secondary | ICD-10-CM | POA: Insufficient documentation

## 2016-09-06 DIAGNOSIS — Z170 Estrogen receptor positive status [ER+]: Secondary | ICD-10-CM | POA: Insufficient documentation

## 2016-09-06 DIAGNOSIS — F17200 Nicotine dependence, unspecified, uncomplicated: Secondary | ICD-10-CM | POA: Insufficient documentation

## 2016-09-06 HISTORY — DX: Malignant neoplasm of unspecified site of unspecified female breast: C50.919

## 2016-09-06 NOTE — Progress Notes (Signed)
See progress note under physician encounter. 

## 2016-09-06 NOTE — Progress Notes (Signed)
Radiation Oncology         [phone] ________________________________  Initial outpatient Consultation  Name: Jamie Gilbert MRN: [id number]  Date: 09/06/2016  DOB: 1956/08/30  VQ:QVZDGL,OVFI J, MD  Magrinat, Virgie Dad, MD   REFERRING PHYSICIAN: Magrinat, Virgie Dad, MD  DIAGNOSIS: The encounter diagnosis was Neoplasm of breast, primary tumor staging category tis: ductal carcinoma in situ (DCIS), left.    ICD-9-CM ICD-10-CM   1. Neoplasm of breast, primary tumor staging category tis: ductal carcinoma in situ (DCIS), left 233.0 D05.12     HISTORY OF PRESENT ILLNESS: Jamie Gilbert is a 60 y.o. female seen at the request of Dr. Jana Hakim for a new diagnosis of leftt breast cancer. The patient underwent bilateral screening mammography with tomography at the [facility] 06/08/2016. There were calcifications in the left breast which were further evaluated 08/14/2016 with left unilateral diagnostic mammography. The breast density was category B. There was a developing group of heterogeneous calcifications in the left breast lateral to the nipple at the 2:00 position spanning 2 cm. 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 The patient's case was presented in the multidisciplinary breast cancer conference on 08/23/2016 and the patient was subsequently seen by Dr. Jana Hakim in the breast clinic on 09/05/2016. She has met with Dr. Lucia Gaskins as well and he has recommended lumpectomy.  The patient has been referred today for further evaluation and discussion of the role for radiation treatment in the management of her disease.   PREVIOUS RADIATION THERAPY: No  PAST MEDICAL HISTORY:  Past Medical History:  Diagnosis Date  . Breast cancer ([place])    DCIS  . Hyperlipidemia   . Vitamin D deficiency       PAST SURGICAL HISTORY: Past  Surgical History:  Procedure Laterality Date  . ECTOPIC PREGNANCY SURGERY    . medial meniscus repair     left knee w/plica excised  . TUBAL LIGATION      FAMILY HISTORY:  Family History  Problem Relation Age of Onset  . Hypertension Mother   . Aortic aneurysm Mother   . Brain cancer Father   . Prostate cancer Brother 7    SOCIAL HISTORY:  Social History   Social History  . Marital status: Married    Spouse name: N/A  . Number of children: N/A  . Years of education: N/A   Occupational History  . Not on file.   Social History Main Topics  . Smoking status: Current Some Day Smoker    Years: 25.00    Types: Cigarettes, E-cigarettes  . Smokeless tobacco: Current User  . Alcohol use 8.4 oz/week    14 Standard drinks or equivalent per week  . Drug use: Unknown  . Sexual activity: Not on file   Other Topics Concern  . Not on file   Social History Narrative  . No narrative on file    ALLERGIES: Eggs or egg-derived products  MEDICATIONS:  Current Outpatient Prescriptions  Medication Sig Dispense Refill  . calcium-vitamin D (OSCAL WITH D) 500-200 MG-UNIT per tablet Take 1 tablet by mouth daily.      . ergocalciferol (VITAMIN D2) 50000 units capsule Take 1 capsule (50,000 Units total) by mouth once a week. 4 capsule 2  . ibuprofen (ADVIL,MOTRIN) 800 MG tablet TAKE (1) TABLET EVERY EIGHT HOURS AS NEEDED FOR PAIN. 90 tablet 3  . rosuvastatin (CRESTOR) 5 MG tablet Take  1 tablet (5 mg total) by mouth daily. 90 tablet 1   No current facility-administered medications for this encounter.    Age at menarche: 21 years Age menopause: 69-60 Gravida: 3 Maternal age: 26-30 Para: 2   REVIEW OF SYSTEMS:  On review of systems, the patient reports that she is doing well overall. She denies any chest pain, shortness of breath, cough, fevers, chills, night sweats, unintended weight changes. She denies any bowel or bladder disturbances, and denies abdominal pain, nausea or  vomiting. She denies any new musculoskeletal or joint aches or pains. She denies any pain or lymphedema issues. Patient is planning a trip the middle of March for business purposes which may last between 2 and 2.5 weeks. A complete review of systems is obtained and is otherwise negative.     PHYSICAL EXAM:  Wt Readings from Last 3 Encounters:  09/06/16 176 lb 3.2 oz (79.9 kg)  09/05/16 177 lb 4.8 oz (80.4 kg)  08/21/16 180 lb (81.6 kg)   Temp Readings from Last 3 Encounters:  09/06/16 99.4 F (37.4 C) (Oral)  09/05/16 98.4 F (36.9 C) (Oral)  08/21/16 98.4 F (36.9 C) (Tympanic)   BP Readings from Last 3 Encounters:  09/06/16 129/75  09/05/16 122/76  08/21/16 130/84   Pulse Readings from Last 3 Encounters:  09/06/16 78  09/05/16 84  08/21/16 86    In general this is a well appearing Caucasian female in no acute distress. She is alert and oriented x4 and appropriate throughout the examination. HEENT reveals that the patient is normocephalic, atraumatic. EOMs are intact. PERRLA. Skin is intact without any evidence of gross lesions. Cardiovascular exam reveals a regular rate and rhythm, no clicks rubs or murmurs are auscultated. Chest is clear to auscultation bilaterally. Lymphatic assessment is performed and does not reveal any adenopathy in the cervical, supraclavicular, axillary, or inguinal chains. Abdomen has active bowel sounds in all quadrants and is intact. The abdomen is soft, non tender, non distended. Lower extremities are negative for pretibial pitting edema, deep calf tenderness, cyanosis or clubbing. Breast exam shows minimal ecchymosis at the 4 o'clock position of the left breast without palpable mass. No nipple discharge or bleeding. Right breast appeared normal without palpable mass and no nipple discharge or bleeding.   KPS = 90  100 - Normal; no complaints; no evidence of disease. 90   - Able to carry on normal activity; minor signs or symptoms of disease. 80   -  Normal activity with effort; some signs or symptoms of disease. 8   - Cares for self; unable to carry on normal activity or to do active work. 60   - Requires occasional assistance, but is able to care for most of his personal needs. 50   - Requires considerable assistance and frequent medical care. 21   - Disabled; requires special care and assistance. 26   - Severely disabled; hospital admission is indicated although death not imminent. 5   - Very sick; hospital admission necessary; active supportive treatment necessary. 10   - Moribund; fatal processes progressing rapidly. 0     - Dead  Karnofsky DA, Abelmann Duncan, Craver LS and Burchenal [facility] [phone]) The use of the nitrogen mustards in the palliative treatment of carcinoma: with particular reference to bronchogenic carcinoma Cancer 1 634-56  LABORATORY DATA:  Lab Results  Component Value Date   WBC 6.6 09/05/2016   HGB 13.0 09/05/2016   HCT 39.2 09/05/2016   MCV 97.7 09/05/2016   PLT  327 09/05/2016   Lab Results  Component Value Date   NA 140 09/05/2016   K 3.8 09/05/2016   CL 106 06/06/2016   CO2 26 09/05/2016   Lab Results  Component Value Date   ALT 16 09/05/2016   AST 18 09/05/2016   ALKPHOS 60 09/05/2016   BILITOT <0.22 09/05/2016     RADIOGRAPHY: Mm Digital Diagnostic Unilat L  Result Date: 08/14/2016 CLINICAL DATA:  Recall from screening mammography. EXAM: DIGITAL DIAGNOSTIC LEFT MAMMOGRAM WITH CAD COMPARISON:  Previous exam(s). ACR Breast Density Category b: There are scattered areas of fibroglandular density. FINDINGS: Magnification views of the left breast demonstrate a developing group of heterogeneous calcifications to be present located lateral to the left nipple at the 2 o'clock position within the anterior portion of the breast spanning 2 cm. Tissue sampling is recommended. I have discussed stereotactic biopsy of the calcifications with the patient. This will be scheduled. Mammographic images were processed with  CAD. IMPRESSION: 2 cm group of heterogeneous calcifications located within the left breast at 2 o'clock position. Stereotactic biopsy is recommended. This will be scheduled. RECOMMENDATION: Left breast stereotactic biopsy. I have discussed the findings and recommendations with the patient. Results were also provided in writing at the conclusion of the visit. If applicable, a reminder letter will be sent to the patient regarding the next appointment. BI-RADS CATEGORY  4: Suspicious. Electronically Signed   By: Altamese Cabal M.D.   On: 08/14/2016 09:18   Mm Clip Placement Left  Result Date: 08/17/2016 CLINICAL DATA:  Status post stereotactic guided core needle biopsy of a 2 cm group of microcalcifications in the 2-3 o'clock position of the left breast. EXAM: DIAGNOSTIC LEFT MAMMOGRAM POST STEREOTACTIC BIOPSY COMPARISON:  Previous exam(s). FINDINGS: Mammographic images were obtained following stereotactic guided biopsy of the recently demonstrated 2 cm group of microcalcifications in the 2 to 3 o'clock position of the left breast. These demonstrate a ribbon shaped biopsy marker clip at the location of the biopsied calcifications. There is a small post biopsy hematoma at that location. Multiple calcifications are absent following biopsy. IMPRESSION: Appropriate clip deployment following left breast stereotactic guided core needle biopsy. Final Assessment: Post Procedure Mammograms for Marker Placement Electronically Signed   By: Claudie Revering M.D.   On: 08/17/2016 11:08   Mm Lt Breast Bx W Loc Dev 1st Lesion Image Bx Spec Stereo Guide  Addendum Date: 08/19/2016   ADDENDUM REPORT: 08/18/2016 14:30 ADDENDUM: Pathology revealed INTERMEDIATE GRADE DUCTAL CARCINOMA IN SITU WITH CALCIFICATIONS of the Left breast, 2-3 o'clock. This was found to be concordant by Dr. Claudie Revering. Pathology results were discussed with the patient by telephone. The patient reported doing well after the biopsy with tenderness at the  site. Post biopsy instructions and care were reviewed and questions were answered. The patient was encouraged to call [facility] for any additional concerns. Surgical consultation has been arranged with Dr. [name] at [facility] on August 24, 2016. Pathology results reported by Terie Purser, RN on 08/18/2016. Electronically Signed   By: Claudie Revering M.D.   On: 08/18/2016 14:30   Result Date: 08/19/2016 CLINICAL DATA:  2 cm group of calcifications in the outer left breast in the 2-3 o'clock position at recent mammography. EXAM: LEFT BREAST STEREOTACTIC CORE NEEDLE BIOPSY COMPARISON:  Previous exams. FINDINGS: The patient and I discussed the procedure of stereotactic-guided biopsy including benefits and alternatives. We discussed the high likelihood of a successful procedure. We discussed the risks  of the procedure including infection, bleeding, tissue injury, clip migration, and inadequate sampling. Informed written consent was given. The usual time out protocol was performed immediately prior to the procedure. Using sterile technique and 1% and 2% Lidocaine as local anesthetic, under stereotactic guidance, a 9 gauge vacuum assisted device was used to perform core needle biopsy of the recently demonstrated 2 cm group of microcalcifications in the anterior aspect of the left breast in the 2-3 o'clock position using a lateral approach. Specimen radiograph was performed showing multiple calcifications within multiple specimens. Specimens with calcifications are identified for pathology. At the conclusion of the procedure, a ribbon shaped tissue marker clip was deployed into the biopsy cavity. Follow-up 2-view mammogram was performed and dictated separately. IMPRESSION: Stereotactic-guided biopsy of a 2 cm group of microcalcifications in the 2-3 o'clock position of the left breast. No apparent complications. Electronically Signed: By: Claudie Revering M.D. On: 08/17/2016  10:42      IMPRESSION/PLAN: 1. 60 y.o. woman with left breast DCIS, grade 2, ER(+) PR(+). Today, we talked with the patient and family about the findings and work-up thus far.  Dr. Tammi Klippel discusses the pathology findings and reviews the nature of in situ disease. The consensus from the breast conference include breast conservation with lumpectomy. The patient's course would then be followed by external radiotherapy to the breast followed by antiestrogen therapy. We discussed the risks, benefits, short, and long term effects of radiotherapy, and the patient is interested in proceeding. Dr. Tammi Klippel discusses the delivery and logistics of radiotherapy,and would anticipate a 4 week course of treatment. We will see her back about 2 weeks after surgery to move forward with the simulation and planning process and anticipate starting radiotherapy about 4 weeks after surgery. The patient has a business trip planned in mid-March that will last 2 to 2.5 weeks. We will take this into consideration while planning her treatment schedule. 2. Tobacco use. She was counseled on the benefits of complete smoking cessation in surgical healing and impacts for radiation treatment.   The above documentation reflects my direct findings during this shared patient visit. Please see the separate note by Dr. Tammi Klippel on this date for the remainder of the patient's plan of care.   Carola Rhine, PAC   This document serves as a record of services personally performed by [name], MD and Shona Simpson, PA-C. It was created on their behalf by Arlyce Harman, a trained medical scribe. The creation of this record is based on the scribe's personal observations and the provider's statements to them. This document has been checked and approved by the attending provider.

## 2016-09-06 NOTE — Progress Notes (Signed)
Location of Breast Cancer: left breast DCIS  Histology per Pathology Report:     Receptor Status: ER(+), PR (+), Her2-neu (N/A), Ki-(N/A)  Did patient present with symptoms (if so, please note symptoms) or was this found on screening mammography?: abnormal mammogram on 06/14/2016  Past/Anticipated interventions by surgeon, if [id number], planning breast conserving surgery definitive decision regarding antiestrogen therapy hasn't been made. No surgery date has been set.  Past/Anticipated interventions by medical oncology, if any: Seen by Magrinat yesterday; a definitive decision regarding antiestrogen therapy hasn't been made    Lymphedema issues, if any:  No  Pain issues, if any:   No  SAFETY ISSUES:  Prior radiation? No  Pacemaker/ICD? No  Possible current pregnancy?no  Is the patient on methotrexate? no  Current Complaints / other details:  60 year old female. Married with two children. Current everyday smoker. Patient planning a trip the middle of March.   Age at menarche: 35 years Age menopause: 54-60 Gravida: 3 Maternal age: 63-30 Para: 2    Joaquim Lai, RN 09/06/2016,7:59 AM

## 2016-09-08 ENCOUNTER — Encounter: Payer: Self-pay | Admitting: Radiation Oncology

## 2016-09-13 ENCOUNTER — Telehealth: Payer: Self-pay | Admitting: Radiation Oncology

## 2016-09-13 NOTE — Telephone Encounter (Signed)
Pt is having surgery 09/29/16. I called to put her on the sim schedule 10/12/16 at 10am

## 2016-09-14 ENCOUNTER — Other Ambulatory Visit: Payer: Self-pay | Admitting: Surgery

## 2016-09-14 DIAGNOSIS — D0592 Unspecified type of carcinoma in situ of left breast: Secondary | ICD-10-CM

## 2016-09-15 ENCOUNTER — Other Ambulatory Visit: Payer: Self-pay | Admitting: Surgery

## 2016-09-15 DIAGNOSIS — D0592 Unspecified type of carcinoma in situ of left breast: Secondary | ICD-10-CM

## 2016-09-21 ENCOUNTER — Encounter (HOSPITAL_BASED_OUTPATIENT_CLINIC_OR_DEPARTMENT_OTHER): Payer: Self-pay | Admitting: *Deleted

## 2016-09-26 ENCOUNTER — Telehealth: Payer: Self-pay | Admitting: Internal Medicine

## 2016-09-26 ENCOUNTER — Other Ambulatory Visit: Payer: Managed Care, Other (non HMO) | Admitting: Internal Medicine

## 2016-09-26 NOTE — Telephone Encounter (Signed)
Spoke with patient; she has breast surgery scheduled Friday, 09/29/16 @ 3:30.  She has a follow appointment on 10/12/16.  She would like to plan to follow up with you after 10/12/16.  She stated she will call us back after that and schedule the appointment.  She stated that they are planning to do the implant on Thursday.   She is scheduled to have radiation once a day Monday through Friday for 4 weeks.  And, she's still planning to go to [place].  Advised that you are fine with her cancelling this appointment, you just wanted to know what her intent was.    She will call back after 10/12/16 follow up appointment with her surgeon.

## 2016-09-27 ENCOUNTER — Ambulatory Visit
Admission: RE | Admit: 2016-09-27 | Discharge: 2016-09-27 | Disposition: A | Discharge status: Home / Self Care | Payer: Managed Care, Other (non HMO) | Source: Ambulatory Visit | Attending: Surgery | Admitting: Surgery

## 2016-09-27 DIAGNOSIS — D0592 Unspecified type of carcinoma in situ of left breast: Secondary | ICD-10-CM

## 2016-09-27 NOTE — Progress Notes (Signed)
Pt in to pick up Boost Breeze, Instructions reviewed. 

## 2016-09-28 NOTE — H&P (Signed)
Jamie Gilbert  Location: [facility] Patient #: [id number] DOB: 10-Oct-1955 Married / Language: English / Race: White Female  History of Present Illness   Patient words: Breast Cancer.   The patient is a 61 year old female who presents with a complaint of cancer of right breast.   The PCP is Dr. Merrilee Jansky. [place]  The patient was referred by Dr. Merrilee Jansky. [place]  She comes with Helaine Chess, a friend.  She had a mammogram on 10/15/2015 which showed a 2 cm area of microca++ at 2:00 in the left breast. She underwent a left breast biopsy onh 08/17/2016 ([id number]) which showed DCIS, ER - 80% and PR - 10%. She had a prior biopsy of her right breast she thinks about 5 years ago. She has no family history of breast cancer or ovarian cancer. She went through menopause about 5 years ago. She is not on hormonal therapy.  I discussed the options for breast cancer treatment with the patient. I discussed a multidisciplinary approach to the treatment of breast cancer, which includes medical oncology and radiation oncology. I discussed the surgical options of lumpectomy vs. mastectomy. If mastectomy, there is the possibility of reconstruction. I discussed the options of lymph node biopsy. She does not need a lymph node biopsy. The treatment plan depends on the pathologic staging of the tumor and the patient's personal wishes. The risks of surgery include, but are not limited to, bleeding, infection, the need for further surgery, and nerve injury. The patient has been given literature on the treatment of breast cancer. Because she is a favorable DCIS - I talked about the COMET trial, but she is not interested.  She taped our conversation. Plan: 1) She has specific people that she wants for oncology. Will make medical oncology referral (Magrinat) and rad oncology referral Tammi Klippel). We will talk on phone after consults and then schedule her surgery. Of  course the problem is the Christmas holiday. I tried to reassure her that her diagnosis is extremely favorable and waiting until next year for surgery would be okay.  Past Medical History: 1. Smokes 2. RSD of the left leg Had prior arthroscopic surgery on the knee in the 1990's Has about 95% function  Social History: Married. Her husband was in [place] today. She has 2 children: 9 and 37 (they live in [place]) She does not work. 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   Other Problems Mammie Lorenzo, LPN; D34-534 [id number] AM) Hypercholesterolemia   Past Surgical History Mammie Lorenzo, LPN; D34-534 [id number] AM) Breast Biopsy  Left. Knee Surgery  Left.  Diagnostic Studies History Mammie Lorenzo, LPN; D34-534 579FGE AM) Colonoscopy  5-10 years ago Mammogram  within last year  Allergies Mammie Lorenzo, LPN; D34-534 579FGE AM) Eggs  Nausea, Vomiting.  Medication History Mammie Lorenzo, LPN; D34-534 579FGE AM) Vitamin D (Ergocalciferol) (50000UNIT Capsule, Oral) Active. Rosuvastatin Calcium (5MG  Tablet, Oral) Active. Ibuprofen (200MG  Capsule, 800 Oral as needed) Active. Medications Reconciled  Social History Mammie Lorenzo, LPN; D34-534 579FGE AM) Alcohol use  Moderate alcohol use. Caffeine use  Carbonated beverages, Coffee. No drug use  Tobacco use  Current every day smoker.  Family History Mammie Lorenzo, LPN; D34-534 579FGE AM) Hypertension  Mother.  Pregnancy / Birth History Mammie Lorenzo, LPN; D34-534 579FGE AM) Age at menarche  6 years. Age of menopause  49-60 Gravida  3 Maternal age  72-30 Para  2    Review  of Systems Claiborne Billings Dockery LPN; D34-534 579FGE AM) General Not Present- Appetite Loss, Chills, Fatigue, Fever, Night Sweats, Weight Gain and Weight Loss. Skin Not Present- Change in Wart/Mole, Dryness, Hives, Jaundice, New Lesions, Non-Healing Wounds, Rash  and Ulcer. HEENT Not Present- Earache, Hearing Loss, Hoarseness, Nose Bleed, Oral Ulcers, Ringing in the Ears, Seasonal Allergies, Sinus Pain, Sore Throat, Visual Disturbances, Wears glasses/contact lenses and Yellow Eyes. Respiratory Not Present- Bloody sputum, Chronic Cough, Difficulty Breathing, Snoring and Wheezing. Breast Present- Breast Mass. Not Present- Breast Pain, Nipple Discharge and Skin Changes. Cardiovascular Not Present- Chest Pain, Difficulty Breathing Lying Down, Leg Cramps, Palpitations, Rapid Heart Rate, Shortness of Breath and Swelling of Extremities. Gastrointestinal Not Present- Abdominal Pain, Bloating, Bloody Stool, Change in Bowel Habits, Chronic diarrhea, Constipation, Difficulty Swallowing, Excessive gas, Gets full quickly at meals, Hemorrhoids, Indigestion, Nausea, Rectal Pain and Vomiting. Female Genitourinary Not Present- Frequency, Nocturia, Painful Urination, Pelvic Pain and Urgency. Musculoskeletal Not Present- Back Pain, Joint Pain, Joint Stiffness, Muscle Pain, Muscle Weakness and Swelling of Extremities. Neurological Not Present- Decreased Memory, Fainting, Headaches, Numbness, Seizures, Tingling, Tremor, Trouble walking and Weakness. Psychiatric Not Present- Anxiety, Bipolar, Change in Sleep Pattern, Depression, Fearful and Frequent crying. Endocrine Not Present- Cold Intolerance, Excessive Hunger, Hair Changes, Heat Intolerance, Hot flashes and New Diabetes. Hematology Not Present- Blood Thinners, Easy Bruising, Excessive bleeding, Gland problems, HIV and Persistent Infections.  Vitals Claiborne Billings Dockery LPN; D34-534 [id number] AM) 08/24/2016 8:47 AM Weight: 179.8 lb Height: 70in Body Surface Area: 1.99 m Body Mass Index: 25.8 kg/m  Temp.: 97.74F(Oral)  Pulse: 76 (Regular)  BP: 122/80 (Sitting, Left Arm, Standard)   Physical Exam  General: Older WFalert and generally healthy appearing. Skin: Inspection and palpation of the skin  unremarkable.  Eyes: Conjunctivae white, pupils equal. Face, ears, nose, mouth, and throat: Face - normal. Normal ears and nose. Lips and teeth normal.  Neck: Supple. No mass. Trachea midline. No thyroid mass.  Lymph Nodes: No supraclavicular or cervical adenopathy. No axillary adenopathy.  Lungs: Normal respiratory effort. Clear to auscultation and symmetric breath sounds. Cardiovascular: Regular rate and rythm. Normal auscultation of the heart. No murmur or rub. Normal carotid pulse.  Breasts - Right: No mass or nodule Left: bruise at 3 o'clock, just adjacent to the areola  Abdomen: Soft. No mass. Liver and spleen not palpable. No tenderness. No hernia. Normal bowel sounds. No abdominal scars.  Musculoskeletal/extremities: Normal gait. Good strength and ROM in upper and lower extremities.   Neurologic: Grossly intact to motor and sensory function.   Psychiatric: Has normal mood and affect. Judgement and insight appear normal.    Assessment & Plan  1.  BREAST CANCER, STAGE 0, LEFT (D05.92)  Story: Left breast biopsy onh 08/17/2016 ([id number]) which showed DCIS, ER - 80% and PR - 10%   Impression: Plan:   1) Medical oncology referral (Magrinat) and rad oncology referral Tammi Klippel)   2) Schedule left breast lumpectomy (seed localization)  2.  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 RSD of the left leg  Had prior arthroscopic surgery on the knee in the 1990's  Has about 95% function   [name], MD, [facility] Pager: [phone] Office phone:  [phone]

## 2016-09-29 ENCOUNTER — Encounter (HOSPITAL_COMMUNITY)
Admission: RE | Disposition: A | Discharge status: Home / Self Care | Payer: Self-pay | Source: Ambulatory Visit | Attending: Surgery

## 2016-09-29 ENCOUNTER — Ambulatory Visit (HOSPITAL_COMMUNITY): Payer: Managed Care, Other (non HMO) | Admitting: Certified Registered"

## 2016-09-29 ENCOUNTER — Ambulatory Visit (HOSPITAL_BASED_OUTPATIENT_CLINIC_OR_DEPARTMENT_OTHER)
Admission: RE | Admit: 2016-09-29 | Discharge: 2016-09-29 | Disposition: A | Discharge status: Home / Self Care | Payer: Managed Care, Other (non HMO) | Source: Ambulatory Visit | Attending: Surgery | Admitting: Surgery

## 2016-09-29 ENCOUNTER — Encounter (HOSPITAL_BASED_OUTPATIENT_CLINIC_OR_DEPARTMENT_OTHER): Payer: Self-pay | Admitting: Certified Registered"

## 2016-09-29 ENCOUNTER — Ambulatory Visit
Admission: RE | Admit: 2016-09-29 | Discharge: 2016-09-29 | Disposition: A | Discharge status: Home / Self Care | Payer: Managed Care, Other (non HMO) | Source: Ambulatory Visit | Attending: Surgery | Admitting: Surgery

## 2016-09-29 ENCOUNTER — Ambulatory Visit: Payer: Managed Care, Other (non HMO) | Admitting: Internal Medicine

## 2016-09-29 DIAGNOSIS — D0592 Unspecified type of carcinoma in situ of left breast: Secondary | ICD-10-CM

## 2016-09-29 DIAGNOSIS — Z91012 Allergy to eggs: Secondary | ICD-10-CM | POA: Insufficient documentation

## 2016-09-29 DIAGNOSIS — E7800 Pure hypercholesterolemia, unspecified: Secondary | ICD-10-CM | POA: Diagnosis not present

## 2016-09-29 DIAGNOSIS — D0512 Intraductal carcinoma in situ of left breast: Secondary | ICD-10-CM | POA: Diagnosis not present

## 2016-09-29 DIAGNOSIS — G90522 Complex regional pain syndrome I of left lower limb: Secondary | ICD-10-CM | POA: Diagnosis not present

## 2016-09-29 DIAGNOSIS — C50912 Malignant neoplasm of unspecified site of left female breast: Secondary | ICD-10-CM | POA: Diagnosis present

## 2016-09-29 DIAGNOSIS — F17200 Nicotine dependence, unspecified, uncomplicated: Secondary | ICD-10-CM | POA: Insufficient documentation

## 2016-09-29 HISTORY — PX: BREAST LUMPECTOMY WITH RADIOACTIVE SEED LOCALIZATION: SHX6424

## 2016-09-29 HISTORY — DX: Other specified postprocedural states: Z98.890

## 2016-09-29 HISTORY — DX: Nausea with vomiting, unspecified: R11.2

## 2016-09-29 LAB — BASIC METABOLIC PANEL
ANION GAP: 8 (ref 5–15)
BUN: 8 mg/dL (ref 6–20)
CHLORIDE: 109 mmol/L (ref 101–111)
CO2: 24 mmol/L (ref 22–32)
Calcium: 9.3 mg/dL (ref 8.9–10.3)
Creatinine, Ser: 0.68 mg/dL (ref 0.44–1.00)
GFR calc Af Amer: >60 mL/min (ref >60)
GFR calc non Af Amer: >60 mL/min (ref >60)
Glucose, Bld: 84 mg/dL (ref 65–99)
POTASSIUM: 3.5 mmol/L (ref 3.5–5.1)
SODIUM: 141 mmol/L (ref 135–145)

## 2016-09-29 LAB — CBC
HCT: 37.5 % (ref 36.0–46.0)
HEMOGLOBIN: 12.5 g/dL (ref 12.0–15.0)
MCH: 31.8 pg (ref 26.0–34.0)
MCHC: 33.3 g/dL (ref 30.0–36.0)
MCV: 95.4 fL (ref 78.0–100.0)
Platelets: 314 10*3/uL (ref 150–400)
RBC: 3.93 MIL/uL (ref 3.87–5.11)
RDW: 13.7 % (ref 11.5–15.5)
WBC: 5.9 10*3/uL (ref 4.0–10.5)

## 2016-09-29 SURGERY — BREAST LUMPECTOMY WITH RADIOACTIVE SEED LOCALIZATION
Anesthesia: General | Site: Breast | Laterality: Left

## 2016-09-29 MED ORDER — PROPOFOL 10 MG/ML IV BOLUS
INTRAVENOUS | Status: AC
  Filled 2016-09-29: qty 20

## 2016-09-29 MED ORDER — LIDOCAINE 2% (20 MG/ML) 5 ML SYRINGE
INTRAMUSCULAR | Status: AC
  Filled 2016-09-29: qty 5

## 2016-09-29 MED ORDER — DEXAMETHASONE SODIUM PHOSPHATE 4 MG/ML IJ SOLN
INTRAMUSCULAR | Status: DC | PRN
  Administered 2016-09-29: 10 mg via INTRAVENOUS

## 2016-09-29 MED ORDER — LACTATED RINGERS IV SOLN
INTRAVENOUS | Status: DC | PRN
  Administered 2016-09-29 (×3): via INTRAVENOUS

## 2016-09-29 MED ORDER — GABAPENTIN 300 MG PO CAPS
300.0000 mg | ORAL_CAPSULE | ORAL | Status: AC
  Administered 2016-09-29: 300 mg via ORAL

## 2016-09-29 MED ORDER — CEFAZOLIN SODIUM-DEXTROSE 2-3 GM-% IV SOLR
INTRAVENOUS | Status: DC | PRN
  Administered 2016-09-29: 2 g via INTRAVENOUS

## 2016-09-29 MED ORDER — GABAPENTIN 300 MG PO CAPS
ORAL_CAPSULE | ORAL | Status: AC
  Filled 2016-09-29: qty 1

## 2016-09-29 MED ORDER — MIDAZOLAM HCL 2 MG/2ML IJ SOLN
INTRAMUSCULAR | Status: AC
  Filled 2016-09-29: qty 2

## 2016-09-29 MED ORDER — MIDAZOLAM HCL 5 MG/5ML IJ SOLN
INTRAMUSCULAR | Status: DC | PRN
  Administered 2016-09-29 (×2): 1 mg via INTRAVENOUS

## 2016-09-29 MED ORDER — SCOPOLAMINE 1 MG/3DAYS TD PT72
MEDICATED_PATCH | TRANSDERMAL | Status: AC
  Filled 2016-09-29: qty 1

## 2016-09-29 MED ORDER — FENTANYL CITRATE (PF) 100 MCG/2ML IJ SOLN: 50.0000 ug | INTRAMUSCULAR | Status: DC | PRN

## 2016-09-29 MED ORDER — HYDROCODONE-ACETAMINOPHEN 5-325 MG PO TABS: 1.0000 | ORAL_TABLET | Freq: Four times a day (QID) | ORAL | 0 refills | Status: DC | PRN

## 2016-09-29 MED ORDER — DEXAMETHASONE SODIUM PHOSPHATE 10 MG/ML IJ SOLN
INTRAMUSCULAR | Status: AC
  Filled 2016-09-29: qty 1

## 2016-09-29 MED ORDER — PROPOFOL 10 MG/ML IV BOLUS
INTRAVENOUS | Status: DC | PRN
  Administered 2016-09-29: 170 mg via INTRAVENOUS
  Administered 2016-09-29: 30 mg via INTRAVENOUS

## 2016-09-29 MED ORDER — GLYCOPYRROLATE 0.2 MG/ML IJ SOLN
INTRAMUSCULAR | Status: DC | PRN
  Administered 2016-09-29: 0.2 mg via INTRAVENOUS

## 2016-09-29 MED ORDER — OXYCODONE HCL 5 MG PO TABS: 5.0000 mg | ORAL_TABLET | Freq: Once | ORAL | Status: DC | PRN

## 2016-09-29 MED ORDER — ARTIFICIAL TEARS OP OINT
TOPICAL_OINTMENT | OPHTHALMIC | Status: AC
  Filled 2016-09-29: qty 3.5

## 2016-09-29 MED ORDER — CHLORHEXIDINE GLUCONATE CLOTH 2 % EX PADS: 6.0000 | MEDICATED_PAD | Freq: Once | CUTANEOUS | Status: DC

## 2016-09-29 MED ORDER — LACTATED RINGERS IV SOLN: INTRAVENOUS | Status: DC

## 2016-09-29 MED ORDER — LIDOCAINE HCL (CARDIAC) 20 MG/ML IV SOLN
INTRAVENOUS | Status: DC | PRN
  Administered 2016-09-29: 50 mg via INTRAVENOUS

## 2016-09-29 MED ORDER — CEFAZOLIN SODIUM-DEXTROSE 2-4 GM/100ML-% IV SOLN
INTRAVENOUS | Status: AC
  Filled 2016-09-29: qty 100

## 2016-09-29 MED ORDER — ROCURONIUM BROMIDE 50 MG/5ML IV SOSY
PREFILLED_SYRINGE | INTRAVENOUS | Status: AC
  Filled 2016-09-29: qty 5

## 2016-09-29 MED ORDER — MIDAZOLAM HCL 2 MG/2ML IJ SOLN: 1.0000 mg | INTRAMUSCULAR | Status: DC | PRN

## 2016-09-29 MED ORDER — EPHEDRINE SULFATE 50 MG/ML IJ SOLN
INTRAMUSCULAR | Status: DC | PRN
  Administered 2016-09-29: 15 mg via INTRAVENOUS
  Administered 2016-09-29 (×2): 10 mg via INTRAVENOUS

## 2016-09-29 MED ORDER — PROPOFOL 10 MG/ML IV BOLUS
INTRAVENOUS | Status: AC
Start: 2016-09-29 — End: 2016-09-29
  Filled 2016-09-29: qty 20

## 2016-09-29 MED ORDER — ONDANSETRON HCL 4 MG/2ML IJ SOLN
4.0000 mg | Freq: Once | INTRAMUSCULAR | Status: DC | PRN
Start: 2016-09-29 — End: 2016-09-29

## 2016-09-29 MED ORDER — CEFAZOLIN SODIUM-DEXTROSE 2-4 GM/100ML-% IV SOLN
INTRAVENOUS | Status: AC
Start: 2016-09-29 — End: 2016-09-29
  Filled 2016-09-29: qty 100

## 2016-09-29 MED ORDER — BUPIVACAINE HCL (PF) 0.25 % IJ SOLN
INTRAMUSCULAR | Status: DC | PRN
  Administered 2016-09-29: 30 mL

## 2016-09-29 MED ORDER — PROPOFOL 10 MG/ML IV BOLUS: INTRAVENOUS | Status: DC | PRN

## 2016-09-29 MED ORDER — FENTANYL CITRATE (PF) 100 MCG/2ML IJ SOLN: 25.0000 ug | INTRAMUSCULAR | Status: DC | PRN

## 2016-09-29 MED ORDER — OXYCODONE HCL 5 MG/5ML PO SOLN: 5.0000 mg | Freq: Once | ORAL | Status: DC | PRN

## 2016-09-29 MED ORDER — SUCCINYLCHOLINE CHLORIDE 20 MG/ML IJ SOLN
INTRAMUSCULAR | Status: DC | PRN
  Administered 2016-09-29: 100 mg via INTRAVENOUS

## 2016-09-29 MED ORDER — ONDANSETRON HCL 4 MG/2ML IJ SOLN
INTRAMUSCULAR | Status: AC
  Filled 2016-09-29: qty 2

## 2016-09-29 MED ORDER — ACETAMINOPHEN 500 MG PO TABS
1000.0000 mg | ORAL_TABLET | ORAL | Status: AC
  Administered 2016-09-29: 1000 mg via ORAL

## 2016-09-29 MED ORDER — SCOPOLAMINE 1 MG/3DAYS TD PT72
1.0000 | MEDICATED_PATCH | TRANSDERMAL | Status: DC
  Administered 2016-09-29: 1 via TRANSDERMAL

## 2016-09-29 MED ORDER — ONDANSETRON HCL 4 MG/2ML IJ SOLN
INTRAMUSCULAR | Status: DC | PRN
  Administered 2016-09-29: 4 mg via INTRAVENOUS

## 2016-09-29 MED ORDER — FENTANYL CITRATE (PF) 100 MCG/2ML IJ SOLN
INTRAMUSCULAR | Status: DC | PRN
  Administered 2016-09-29: 100 ug via INTRAVENOUS

## 2016-09-29 MED ORDER — 0.9 % SODIUM CHLORIDE (POUR BTL) OPTIME
TOPICAL | Status: DC | PRN
  Administered 2016-09-29: 1000 mL

## 2016-09-29 MED ORDER — ACETAMINOPHEN 500 MG PO TABS
ORAL_TABLET | ORAL | Status: AC
  Filled 2016-09-29: qty 2

## 2016-09-29 MED ORDER — FENTANYL CITRATE (PF) 100 MCG/2ML IJ SOLN
INTRAMUSCULAR | Status: AC
  Filled 2016-09-29: qty 2

## 2016-09-29 MED ORDER — SCOPOLAMINE 1 MG/3DAYS TD PT72: 1.0000 | MEDICATED_PATCH | Freq: Once | TRANSDERMAL | Status: DC | PRN

## 2016-09-29 MED ORDER — CEFAZOLIN SODIUM-DEXTROSE 2-4 GM/100ML-% IV SOLN: 2.0000 g | INTRAVENOUS | Status: DC

## 2016-09-29 MED ORDER — BUPIVACAINE HCL (PF) 0.25 % IJ SOLN
INTRAMUSCULAR | Status: AC
  Filled 2016-09-29: qty 30

## 2016-09-29 MED ORDER — LACTATED RINGERS IV SOLN
INTRAVENOUS | Status: DC
  Administered 2016-09-29: 16:00:00 via INTRAVENOUS

## 2016-09-29 SURGICAL SUPPLY — 42 items
ADH SKN CLS APL DERMABOND .7 (GAUZE/BANDAGES/DRESSINGS) ×1
APPLIER CLIP 9.375 MED OPEN (MISCELLANEOUS)
APR CLP MED 9.3 20 MLT OPN (MISCELLANEOUS)
BINDER BREAST LRG (GAUZE/BANDAGES/DRESSINGS) IMPLANT
BINDER BREAST XLRG (GAUZE/BANDAGES/DRESSINGS) ×1 IMPLANT
BLADE SURG 15 STRL LF DISP TIS (BLADE) ×1 IMPLANT
BLADE SURG 15 STRL SS (BLADE) ×2
CANISTER SUCTION 2500CC (MISCELLANEOUS) ×2 IMPLANT
CHLORAPREP W/TINT 26ML (MISCELLANEOUS) ×2 IMPLANT
CLIP APPLIE 9.375 MED OPEN (MISCELLANEOUS) IMPLANT
CLIP TI WIDE RED SMALL 6 (CLIP) ×2 IMPLANT
COVER PROBE W GEL 5X96 (DRAPES) ×2 IMPLANT
COVER SURGICAL LIGHT HANDLE (MISCELLANEOUS) ×2 IMPLANT
DERMABOND ADVANCED (GAUZE/BANDAGES/DRESSINGS) ×1
DERMABOND ADVANCED .7 DNX12 (GAUZE/BANDAGES/DRESSINGS) ×1 IMPLANT
DEVICE DUBIN SPECIMEN MAMMOGRA (MISCELLANEOUS) ×2 IMPLANT
DRAPE CHEST BREAST 15X10 FENES (DRAPES) ×2 IMPLANT
DRAPE UTILITY XL STRL (DRAPES) ×2 IMPLANT
ELECT COATED BLADE 2.86 ST (ELECTRODE) ×2 IMPLANT
ELECT REM PT RETURN 9FT ADLT (ELECTROSURGICAL) ×2
ELECTRODE REM PT RTRN 9FT ADLT (ELECTROSURGICAL) ×1 IMPLANT
GAUZE SPONGE 4X4 12PLY STRL (GAUZE/BANDAGES/DRESSINGS) ×2 IMPLANT
GLOVE SURG SIGNA 7.5 PF LTX (GLOVE) ×3 IMPLANT
GOWN STRL REUS W/ TWL LRG LVL3 (GOWN DISPOSABLE) ×1 IMPLANT
GOWN STRL REUS W/ TWL XL LVL3 (GOWN DISPOSABLE) ×1 IMPLANT
GOWN STRL REUS W/TWL LRG LVL3 (GOWN DISPOSABLE) ×2
GOWN STRL REUS W/TWL XL LVL3 (GOWN DISPOSABLE) ×2
KIT BASIN OR (CUSTOM PROCEDURE TRAY) ×2 IMPLANT
KIT MARKER MARGIN INK (KITS) ×2 IMPLANT
NEEDLE HYPO 25GX1X1/2 BEV (NEEDLE) ×2 IMPLANT
NS IRRIG 1000ML POUR BTL (IV SOLUTION) IMPLANT
PACK SURGICAL SETUP 50X90 (CUSTOM PROCEDURE TRAY) ×2 IMPLANT
PENCIL BUTTON HOLSTER BLD 10FT (ELECTRODE) ×2 IMPLANT
SPONGE LAP 18X18 X RAY DECT (DISPOSABLE) ×2 IMPLANT
SUT MNCRL AB 4-0 PS2 18 (SUTURE) ×2 IMPLANT
SUT VIC AB 3-0 SH 8-18 (SUTURE) ×2 IMPLANT
SYR BULB 3OZ (MISCELLANEOUS) ×2 IMPLANT
SYR CONTROL 10ML LL (SYRINGE) ×2 IMPLANT
TOWEL OR 17X24 6PK STRL BLUE (TOWEL DISPOSABLE) ×2 IMPLANT
TOWEL OR 17X26 10 PK STRL BLUE (TOWEL DISPOSABLE) ×2 IMPLANT
TUBE CONNECTING 12X1/4 (SUCTIONS) ×2 IMPLANT
YANKAUER SUCT BULB TIP NO VENT (SUCTIONS) ×2 IMPLANT

## 2016-09-29 NOTE — Op Note (Signed)
09/29/2016  6:02 PM  PATIENT:  Jamie Gilbert DOB: 23-Mar-1956 MRN: [id number]  PREOP DIAGNOSIS:  Left Breast Cancer  POSTOP DIAGNOSIS:   Left Breast DCIS, 3 o'clock position, edge of areolar (Tis, N0)  PROCEDURE:   Procedure(s):  LEFT BREAST LUMPECTOMY WITH RADIOACTIVE SEED LOCALIZATION  SURGEON:   [name], M.D.  ANESTHESIA:   general  Anesthesiologist: Roberts Gaudy, MD CRNA: Inda Coke, CRNA; Nada Maclachlan Wall, CRNA  General  EBL:  minimal  ml  DRAINS: none   LOCAL MEDICATIONS USED:   30 cc 1/4% marcaine  SPECIMEN:   Left breast lumpectomy (6 color paint), posterior margin  COUNTS CORRECT:  YES  INDICATIONS FOR PROCEDURE:  Jamie Gilbert is a 61 y.o. (DOB: 02/04/1956) white female whose primary care physician is Elby Showers, MD and comes for left breast lumpectomy.   She had a mammogram on 08/14/2016 which showed a 2 cm area of microca++ at 2:00 in the left breast. She underwent a left breast biopsy on 08/17/2016 ([id number]) which showed DCIS, ER - 80% and PR - 10%. She has seen Drs. Magrinat and [facility].   The options for breast cancer treatment have been discussed with the patient. She elected to proceed with lumpectomy.  She did not need a sentinel lymph node biopsy because the tumor is DCIS and relatively small.    The indications and potential complications of surgery were explained to the patient. Potential complications include, but are not limited to, bleeding, infection, the need for further surgery, and nerve injury.     She had a I131 seed placed on 09/27/2016 in her left breast at [facility].  I confirmed the presence of the I131 seed in the pre op area using the Neoprobe.  The seed is in the 3 o'clock position of the left breast.  OPERATIVE NOTE:   The patient was taken to room # 2 at [facility] where she underwent a general anesthesia  supervised by Anesthesiologist: Roberts Gaudy, MD CRNA: Inda Coke, CRNA; Collier Bullock, CRNA. Her left breast was prepped with  ChloraPrep and sterilely draped.    A time-out and the surgical check list was reviewed.    I turned attention to the cancer which was about at the 3 o'clock position of the left breast.  It was at the lateral edge of areola.   I used the Neoprobe to identify the I131 seed.  I tried to excise an area around the tumor of at least 1 cm.    I excised this block of breast tissue approximately 4 cm by 4 cm  in diameter.   I painted the lumpectomy specimen with the 6 color paint kit and did a specimen mammogram which confirmed the microca++, the clip, and the seed were all in the right position in the specimen.  The specimen was sent to pathology who called back to confirm that they have the seed and the specimen.   I excised a posterior margin and painted this with two colors.  It is the new posterior margin.   I then irrigated the wound with saline. I infiltrated approximately 30 mL of 1/4% Marcaine between the incisions.  I placed 4 clips to mark biopsy cavity, at 12, 3, 6, and 9 o'clock.  I then closed the wound in layers using 3-0 Vicryl sutures for the deep layer. At the skin, I closed the incision with a 4-0 Monocryl suture. The incision was then painted with DermaBond.  She had  gauze place over the wounds and placed in a breast binder.   The patient tolerated the procedure well, was transported to the recovery room in good condition. Sponge and needle count were correct at the end of the case.   Final pathology is pending.   [name], MD, [facility] Pager: [phone] Office phone:  [phone]

## 2016-09-29 NOTE — Interval H&P Note (Signed)
History and Physical Interval Note:  09/29/2016 4:32 PM  Jamie Gilbert  has presented today for surgery, with the diagnosis of Left Breast Cancer  The various methods of treatment have been discussed with the patient and family.   Husband in room.  Seed in position.  After consideration of risks, benefits and other options for treatment, the patient has consented to  Procedure(s): BREAST LUMPECTOMY WITH RADIOACTIVE SEED LOCALIZATION (Left) as a surgical intervention .  The patient's history has been reviewed, patient examined, no change in status, stable for surgery.  I have reviewed the patient's chart and labs.  Questions were answered to the patient's satisfaction.     Jamie Gilbert H

## 2016-09-29 NOTE — Anesthesia Preprocedure Evaluation (Addendum)
Anesthesia Evaluation  Patient identified by MRN, date of birth, ID band Patient awake    Reviewed: Allergy & Precautions, NPO status , Patient's Chart, lab work & pertinent test results  History of Anesthesia Complications (+) PONV and history of anesthetic complications  Airway Mallampati: II  TM Distance: >3 FB Neck ROM: Full    Dental  (+) Teeth Intact, Dental Advisory Given   Pulmonary Current Smoker,    Pulmonary exam normal breath sounds clear to auscultation       Cardiovascular Exercise Tolerance: Good Normal cardiovascular exam Rhythm:Regular Rate:Normal  HLD   Neuro/Psych negative neurological ROS  negative psych ROS   GI/Hepatic negative GI ROS, Neg liver ROS,   Endo/Other  negative endocrine ROS  Renal/GU negative Renal ROS     Musculoskeletal negative musculoskeletal ROS (+)   Abdominal   Peds  Hematology negative hematology ROS (+)   Anesthesia Other Findings Day of surgery medications reviewed with the patient.  Left breast cancer  Reproductive/Obstetrics                             Anesthesia Physical Anesthesia Plan  ASA: II  Anesthesia Plan: General   Post-op Pain Management:    Induction: Intravenous  Airway Management Planned: LMA  Additional Equipment:   Intra-op Plan:   Post-operative Plan: Extubation in OR  Informed Consent: I have reviewed the patients History and Physical, chart, labs and discussed the procedure including the risks, benefits and alternatives for the proposed anesthesia with the patient or authorized representative who has indicated his/her understanding and acceptance.   Dental advisory given  Plan Discussed with: CRNA and Anesthesiologist  Anesthesia Plan Comments: (Risks/benefits of general anesthesia discussed with patient including risk of damage to teeth, lips, gum, and tongue, nausea/vomiting, allergic reactions to  medications, and the possibility of heart attack, stroke and death.  All patient questions answered.  Patient wishes to proceed.)       Anesthesia Quick Evaluation

## 2016-09-29 NOTE — Discharge Instructions (Signed)
[facility] - DISCHARGE INSTRUCTIONS TO PATIENT  Activity:  Driving - May drive in 1 to 3 days.   Lifting - No lifting more than 15 pounds for 5 days, then no limit  Wound Care:   Leave breast binder on for 2 days.  Then may remove and shower.  Diet:  As tolerated  Follow up appointment:  Call Dr. Pollie Friar office [facility]) at [phone] for an appointment in 2 to 3 weeks.  Medications and dosages:  Resume your home medications.  You have a prescription for:  vicodin   Call Dr. Lucia Gaskins or his office  [phone]) if you have:  Temperature greater than 100.4,  Persistent nausea and vomiting,  Severe uncontrolled pain,  Redness, tenderness, or signs of infection (pain, swelling, redness, odor or green/yellow discharge around the site),  Any other questions or concerns you may have after discharge.  In an emergency, call 911 or go to an Emergency Department at a nearby hospital.

## 2016-09-29 NOTE — Transfer of Care (Signed)
Immediate Anesthesia Transfer of Care Note  Patient: Jamie Gilbert  Procedure(s) Performed: Procedure(s): LEFT BREAST LUMPECTOMY WITH RADIOACTIVE SEED LOCALIZATION (Left)  Patient Location: PACU  Anesthesia Type:General  Level of Consciousness: awake and alert   Airway & Oxygen Therapy: Patient Spontanous Breathing and Patient connected to nasal cannula oxygen  Post-op Assessment: Report given to RN, Post -op Vital signs reviewed and stable and Patient moving all extremities X 4  Post vital signs: Reviewed and stable  Last Vitals:  Vitals:   09/29/16 1559 09/29/16 1807  BP: (!) 156/83   Pulse: (!) 59   Resp: 16   Temp: 37.1 C 36.9 C    Last Pain:  Vitals:   09/29/16 1807  TempSrc:   PainSc: 0-No pain      Patients Stated Pain Goal: 0 ([id number])  Complications: No apparent anesthesia complications

## 2016-09-29 NOTE — Anesthesia Procedure Notes (Signed)
Procedure Name: Intubation Performed by: Roberts Gaudy Pre-anesthesia Checklist: Patient identified, Emergency Drugs available, Suction available and Patient being monitored Patient Re-evaluated:Patient Re-evaluated prior to inductionOxygen Delivery Method: Circle system utilized Preoxygenation: Pre-oxygenation with 100% oxygen Intubation Type: IV induction Ventilation: Mask ventilation without difficulty Laryngoscope Size: Mac and 3 Grade View: Grade I Tube type: Oral Tube size: 7.0 mm Number of attempts: 1 Airway Equipment and Method: Stylet Placement Confirmation: ETT inserted through vocal cords under direct vision,  positive ETCO2 and breath sounds checked- equal and bilateral Secured at: 22 cm Tube secured with: Tape Dental Injury: Teeth and Oropharynx as per pre-operative assessment

## 2016-09-29 NOTE — Anesthesia Postprocedure Evaluation (Signed)
Anesthesia Post Note  Patient: Jamie Gilbert  Procedure(s) Performed: Procedure(s) (LRB): LEFT BREAST LUMPECTOMY WITH RADIOACTIVE SEED LOCALIZATION (Left)  Patient location during evaluation: PACU Anesthesia Type: General Level of consciousness: awake, awake and alert and oriented Pain management: pain level controlled Vital Signs Assessment: post-procedure vital signs reviewed and stable Respiratory status: spontaneous breathing, nonlabored ventilation and respiratory function stable Cardiovascular status: blood pressure returned to baseline Anesthetic complications: no       Last Vitals:  Vitals:   09/29/16 1830 09/29/16 1845  BP: 128/88 127/76  Pulse:    Resp:    Temp:      Last Pain:  Vitals:   09/29/16 1807  TempSrc:   PainSc: 0-No pain                 Curren Mohrmann COKER

## 2016-09-30 ENCOUNTER — Encounter (HOSPITAL_COMMUNITY): Payer: Self-pay | Admitting: Surgery

## 2016-10-11 NOTE — Progress Notes (Signed)
  Radiation Oncology         [phone] ________________________________  SIMULATION AND TREATMENT PLANNING NOTE    ICD-9-CM ICD-10-CM   1. Neoplasm of breast, primary tumor staging category tis: ductal carcinoma in situ (DCIS), left 233.0 D05.12     Name: Jamie Gilbert   MRN: [id number]  Date:  10/12/2016  DOB: 10/29/1955  Status:outpatient   DIAGNOSIS: Left Breast cancer.  CONSENT VERIFIED: yes SET UP: Patient is setup supine  IMMOBILIZATION:  The following immobilization was used:Custom Moldable Pillow, breast board.  NARRATIVE: Jamie Gilbert was brought to the [facility].  Identity was confirmed.  All relevant records and images related to the planned course of therapy were reviewed.  Then, the patient was positioned in a stable reproducible clinical set-up for radiation therapy.  Wires were placed to delineate the clinical extent of breast tissue. A wire was placed on the scar as well.  CT images were obtained.  An isocenter was placed. Skin markings were placed.  The position of the heart was then analyzed.  Due to the proximity of the heart to the chest wall, I felt she would benefit from deep inspiration breath hold for cardiac sparing.  She was then coached and rescanned in the breath hold position.  Acceptable cardiac sparing was achieved. The CT images were loaded into the planning software where the target and avoidance structures were contoured.  The radiation prescription was entered and confirmed. The patient was discharged in stable condition and tolerated simulation well.   TREATMENT PLANNING NOTE/3D Simulation Note Treatment planning then occurred. I have requested : MLC's, isodose plan, basic dose calculation  3D simulation was performed.  I personally designed and supervised the construction of 3 medically necessary complex treatment devices in the form of MLCs which will be used for beam modification and to protect critical structures  including the heart and lung as well as the immobilization device which is necessary for reproducible set up.  I have requested a dose volume histogram of the heart, lung and tumor cavity.   Special treatment procedure was performed today due to the extra time and effort required by myself to plan and prepare this patient for deep inspiration breath hold technique.  I have determined cardiac sparing to be of benefit to this patient to prevent long term cardiac damage due to radiation of the heart.  Bellows were placed on the patient's abdomen. To facilitate cardiac sparing, the patient was coached by the radiation therapists on breath hold techniques and breathing practice was performed. Practice waveforms were obtained. The patient was then scanned while maintaining breath hold in the treatment position.  This image was then transferred over to the imaging specialist. The imaging specialist then created a fusion of the free breathing and breath hold scans using the chest wall as the stable structure. I personally reviewed the fusion in axial, coronal and sagittal image planes.  Excellent cardiac sparing was obtained.  I felt the patient is an appropriate candidate for breath hold and the patient will be treated as such.  The image fusion was then reviewed with the patient to reinforce the necessity of reproducible breath hold.  ________________________________  Sheral Apley. Tammi Klippel, M.D.

## 2016-10-12 ENCOUNTER — Ambulatory Visit
Admission: RE | Admit: 2016-10-12 | Discharge: 2016-10-12 | Disposition: A | Discharge status: Home / Self Care | Payer: Managed Care, Other (non HMO) | Source: Ambulatory Visit | Attending: Radiation Oncology | Admitting: Radiation Oncology

## 2016-10-12 DIAGNOSIS — Z79899 Other long term (current) drug therapy: Secondary | ICD-10-CM | POA: Diagnosis not present

## 2016-10-12 DIAGNOSIS — Z510 Encounter for antineoplastic radiation therapy: Secondary | ICD-10-CM | POA: Diagnosis not present

## 2016-10-12 DIAGNOSIS — Z170 Estrogen receptor positive status [ER+]: Secondary | ICD-10-CM | POA: Diagnosis not present

## 2016-10-12 DIAGNOSIS — D0512 Intraductal carcinoma in situ of left breast: Secondary | ICD-10-CM | POA: Diagnosis not present

## 2016-10-12 DIAGNOSIS — F17200 Nicotine dependence, unspecified, uncomplicated: Secondary | ICD-10-CM | POA: Diagnosis not present

## 2016-10-12 NOTE — Progress Notes (Signed)
The patient was seen today for consent report of radiotherapy. 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 She is anticipating 4 weeks of radiotherapy to the left breast, with breath-hold technique during simulation. We reviewed consent and she has signed a copy of this. She is moving forward with radiotherapy. Her skin was assessed, and her lumpectomy site is intact without evidence of failing streaking, bleeding or separation. She will begin treatment in the next 2 weeks.

## 2016-10-16 NOTE — Patient Instructions (Signed)
Patient to see Dr. [name], General Surgeon and Dr. Tressa Busman, oncologist

## 2016-10-16 NOTE — Progress Notes (Signed)
   Subjective:    Patient ID: Jamie Gilbert, female    DOB: 04/26/1956, 61 y.o.   MRN: [id number]  HPI 61 year old Female seen here in September for health maintenance exam and advised to get mammogram. Had abnormal screening mammogram. Left breast mammogram demonstrated a developing group of heterogeneous calcifications lateral to the left nipple at the 2:00 position spanning 2 cm. She had biopsy. Biopsy showed ductal carcinoma in situ with calcifications.  She wants to discuss further management.  She is a smoker. Social alcohol consumption.  She has 2 adult daughters.    Review of Systems     Objective:   Physical Exam Not examined. Spent 20 minutes speaking with her today about diagnosis and anticipated treatment in the near future. She wants to discuss treatment here in [place] versus  another medical center out-of-town. We talked about this in detail today. She knows she  may call back if there are further questions.       Assessment & Plan:  Left breast cancer  Plan: She'll be referred to radiation oncology,  oncology as well as general surgery. Does not want medication for anxiety.

## 2016-10-18 DIAGNOSIS — Z510 Encounter for antineoplastic radiation therapy: Secondary | ICD-10-CM | POA: Diagnosis not present

## 2016-10-19 ENCOUNTER — Ambulatory Visit
Admission: RE | Admit: 2016-10-19 | Discharge: 2016-10-19 | Disposition: A | Discharge status: Home / Self Care | Payer: Managed Care, Other (non HMO) | Source: Ambulatory Visit | Attending: Radiation Oncology | Admitting: Radiation Oncology

## 2016-10-19 DIAGNOSIS — Z510 Encounter for antineoplastic radiation therapy: Secondary | ICD-10-CM | POA: Diagnosis not present

## 2016-10-23 ENCOUNTER — Ambulatory Visit
Admission: RE | Admit: 2016-10-23 | Discharge: 2016-10-23 | Disposition: A | Discharge status: Home / Self Care | Payer: Managed Care, Other (non HMO) | Source: Ambulatory Visit | Attending: Radiation Oncology | Admitting: Radiation Oncology

## 2016-10-23 DIAGNOSIS — Z510 Encounter for antineoplastic radiation therapy: Secondary | ICD-10-CM | POA: Diagnosis not present

## 2016-10-24 ENCOUNTER — Ambulatory Visit
Admission: RE | Admit: 2016-10-24 | Discharge: 2016-10-24 | Disposition: A | Discharge status: Home / Self Care | Payer: Managed Care, Other (non HMO) | Source: Ambulatory Visit | Attending: Radiation Oncology | Admitting: Radiation Oncology

## 2016-10-24 DIAGNOSIS — Z510 Encounter for antineoplastic radiation therapy: Secondary | ICD-10-CM | POA: Diagnosis not present

## 2016-10-25 ENCOUNTER — Ambulatory Visit
Admission: RE | Admit: 2016-10-25 | Discharge: 2016-10-25 | Disposition: A | Discharge status: Home / Self Care | Payer: Managed Care, Other (non HMO) | Source: Ambulatory Visit | Attending: Radiation Oncology | Admitting: Radiation Oncology

## 2016-10-25 DIAGNOSIS — Z510 Encounter for antineoplastic radiation therapy: Secondary | ICD-10-CM | POA: Diagnosis not present

## 2016-10-26 ENCOUNTER — Ambulatory Visit
Admission: RE | Admit: 2016-10-26 | Discharge: 2016-10-26 | Disposition: A | Discharge status: Home / Self Care | Payer: Managed Care, Other (non HMO) | Source: Ambulatory Visit | Attending: Radiation Oncology | Admitting: Radiation Oncology

## 2016-10-26 ENCOUNTER — Encounter: Payer: Self-pay | Admitting: Radiation Oncology

## 2016-10-26 VITALS — BP 128/76 | HR 71 | Temp 98.5°F | Resp 18 | Ht 70.0 in | Wt 182.0 lb

## 2016-10-26 DIAGNOSIS — Z923 Personal history of irradiation: Secondary | ICD-10-CM | POA: Insufficient documentation

## 2016-10-26 DIAGNOSIS — Z510 Encounter for antineoplastic radiation therapy: Secondary | ICD-10-CM | POA: Diagnosis not present

## 2016-10-26 DIAGNOSIS — D0512 Intraductal carcinoma in situ of left breast: Secondary | ICD-10-CM | POA: Insufficient documentation

## 2016-10-26 MED ORDER — RADIAPLEXRX EX GEL
Freq: Once | CUTANEOUS | Status: AC
  Administered 2016-10-26: 09:00:00 via TOPICAL

## 2016-10-26 MED ORDER — ALRA NON-METALLIC DEODORANT (RAD-ONC)
1.0000 "application " | Freq: Once | TOPICAL | Status: AC
  Administered 2016-10-26: 1 via TOPICAL

## 2016-10-26 NOTE — Progress Notes (Signed)
  Radiation Oncology         [phone]   Name: Janaiyah Feo MRN: [id number]   Date: 10/26/2016  DOB: 1956/09/15   Weekly Radiation Therapy Management    ICD-9-CM ICD-10-CM   1. Neoplasm of breast, primary tumor staging category tis: ductal carcinoma in situ (DCIS), left 233.0 D05.12 hyaluronate sodium (RADIAPLEXRX) gel     non-metallic deodorant (ALRA) 1 application    Current Dose: 8 Gy  Planned Dose:  60 Gy  Narrative The patient presents for routine under treatment assessment.  Denies pain or fatigue. The nurse noted no skin changes within the treatment field and no evidence of lymphedema noted. The nurse completed patient education.  Set-up films were reviewed. The chart was checked.  Physical Findings  height is 5\' 10"  (1.778 m) and weight is 182 lb (82.6 kg). Her oral temperature is 98.5 F (36.9 C). Her blood pressure is 128/76 and her pulse is 71. Her respiration is 18 and oxygen saturation is 100%. . Weight essentially stable. Breast exam deferred in light of recently starting treatment.  Impression The patient is tolerating radiation.  Plan Continue treatment as planned. The patient is planning to travel to [place] in late March. The patient also plans to go to the mountains during the weekends. I discussed attempting to move treatment times around to accommodate this.         Sheral Apley Tammi Klippel, M.D.  This document serves as a record of services personally performed by Freeman Caldron, PA-C and [name], MD. It was created on their behalf by Darcus Austin, a trained medical scribe. The creation of this record is based on the scribe's personal observations and the providers' statements to them. This document has been checked and approved by the attending provider.

## 2016-10-26 NOTE — Progress Notes (Signed)
Weight and vitals stable. Denies pain. No skin changes noted within treatment field. No evidence of lymphedema noted. Denies fatigue. Jamie Gilbert is here for patient teaching reviewed  areas of pertinence such as fatigue, hair loss, skin changes, breast tenderness, breast swelling.  Pt able to give teach back of to pat skin, use unscented/gentle soap, and drink plenty of water,apply Radiaplex bid, avoid applying anything to skin within 4 hours of treatment, avoid wearing an under wire bra and to use an electric razor if they must shave. Pt demonstrated understanding of information given and will contact nursing with any questions or concerns.   Denies any of the mentioned areas of pertinence as concerns. Will start using the Radiaplex gel and deodorant today.   Wt Readings from Last 3 Encounters:  10/26/16 182 lb (82.6 kg)  09/29/16 179 lb (81.2 kg)  09/06/16 176 lb 3.2 oz (79.9 kg)  BP 128/76   Pulse 71   Temp 98.5 F (36.9 C) (Oral)   Resp 18   Ht 5\' 10"  (1.778 m)   Wt 182 lb (82.6 kg)   SpO2 100%   BMI 26.11 kg/m

## 2016-10-27 ENCOUNTER — Ambulatory Visit
Admission: RE | Admit: 2016-10-27 | Discharge: 2016-10-27 | Disposition: A | Discharge status: Home / Self Care | Payer: Managed Care, Other (non HMO) | Source: Ambulatory Visit | Attending: Radiation Oncology | Admitting: Radiation Oncology

## 2016-10-27 DIAGNOSIS — Z510 Encounter for antineoplastic radiation therapy: Secondary | ICD-10-CM | POA: Diagnosis not present

## 2016-10-30 ENCOUNTER — Ambulatory Visit
Admission: RE | Admit: 2016-10-30 | Discharge: 2016-10-30 | Disposition: A | Discharge status: Home / Self Care | Payer: Managed Care, Other (non HMO) | Source: Ambulatory Visit | Attending: Radiation Oncology | Admitting: Radiation Oncology

## 2016-10-30 DIAGNOSIS — Z510 Encounter for antineoplastic radiation therapy: Secondary | ICD-10-CM | POA: Diagnosis not present

## 2016-10-31 ENCOUNTER — Ambulatory Visit
Admission: RE | Admit: 2016-10-31 | Discharge: 2016-10-31 | Disposition: A | Discharge status: Home / Self Care | Payer: Managed Care, Other (non HMO) | Source: Ambulatory Visit | Attending: Radiation Oncology | Admitting: Radiation Oncology

## 2016-10-31 DIAGNOSIS — Z510 Encounter for antineoplastic radiation therapy: Secondary | ICD-10-CM | POA: Diagnosis not present

## 2016-11-01 ENCOUNTER — Ambulatory Visit
Admission: RE | Admit: 2016-11-01 | Discharge: 2016-11-01 | Disposition: A | Discharge status: Home / Self Care | Payer: Managed Care, Other (non HMO) | Source: Ambulatory Visit | Attending: Radiation Oncology | Admitting: Radiation Oncology

## 2016-11-01 DIAGNOSIS — Z510 Encounter for antineoplastic radiation therapy: Secondary | ICD-10-CM | POA: Diagnosis not present

## 2016-11-02 ENCOUNTER — Encounter: Payer: Self-pay | Admitting: Radiation Oncology

## 2016-11-02 ENCOUNTER — Ambulatory Visit
Admission: RE | Admit: 2016-11-02 | Discharge: 2016-11-02 | Disposition: A | Discharge status: Home / Self Care | Payer: Managed Care, Other (non HMO) | Source: Ambulatory Visit | Attending: Radiation Oncology | Admitting: Radiation Oncology

## 2016-11-02 VITALS — BP 118/83 | HR 66 | Temp 98.6°F | Ht 70.0 in | Wt 181.6 lb

## 2016-11-02 DIAGNOSIS — D0512 Intraductal carcinoma in situ of left breast: Secondary | ICD-10-CM

## 2016-11-02 DIAGNOSIS — Z510 Encounter for antineoplastic radiation therapy: Secondary | ICD-10-CM | POA: Diagnosis not present

## 2016-11-02 NOTE — Progress Notes (Signed)
  Radiation Oncology         [phone]   Name: Jamie Gilbert MRN: [id number]   Date: 11/02/2016  DOB: [birth year]   Weekly Radiation Therapy Management    ICD-9-CM ICD-10-CM   1. Neoplasm of breast, primary tumor staging category tis: ductal carcinoma in situ (DCIS), left 233.0 D05.12     Current Dose: 18 Gy  Planned Dose:  60 Gy  Narrative The patient presents for routine under treatment assessment.  The patient has completed 9 fractions to the Left Breast. She denies pain or fatigue. She is walking daily for exercise. The patient reports using Radiaplex twice a day as directed. She questions if she can use Aquaphor instead of Radiaplex as it has worked well for her in the past.   Set-up films were reviewed. The chart was checked.  Physical Findings  height is 5\' 10"  (1.778 m) and weight is 181 lb 9.6 oz (82.4 kg). Her temperature is 98.6 F (37 C). Her blood pressure is 118/83 and her pulse is 66. Her oxygen saturation is 100%.  Weight essentially stable. Minimal erythema to the treatment area.  Impression The patient is tolerating radiation.  Plan Continue treatment as planned. I advised the patient that she could use Aquaphor, but I would prefer she use Radiaplex whenever possible during radiation; she could return to using Aquaphor following radiation. I encouraged the patient to continue walking for exercise and to reduce radiation fatigue.         Sheral Apley Tammi Klippel, M.D.  This document serves as a record of services personally performed by [name], MD. It was created on his behalf by Maryla Morrow, a trained medical scribe. The creation of this record is based on the scribe's personal observations and the provider's statements to them. This document has been checked and approved by the attending provider.

## 2016-11-02 NOTE — Progress Notes (Signed)
Jamie Gilbert presents for her 9th fraction of radiation to her Left Breast. She denies pain or fatigue. She is walking daily. Her Left Breast is slightly red. She is using the Radiaplex twice daily. She asks today about using Aquaphor instead of the Radiaplex because it has worked well in the past for her.   BP 118/83   Pulse 66   Temp 98.6 F (37 C)   Ht 5\' 10"  (1.778 m)   Wt 181 lb 9.6 oz (82.4 kg)   SpO2 100% Comment: room air  BMI 26.06 kg/m    Wt Readings from Last 3 Encounters:  11/02/16 181 lb 9.6 oz (82.4 kg)  10/26/16 182 lb (82.6 kg)  09/29/16 179 lb (81.2 kg)

## 2016-11-03 ENCOUNTER — Encounter: Payer: Self-pay | Admitting: Radiation Oncology

## 2016-11-03 ENCOUNTER — Ambulatory Visit
Admission: RE | Admit: 2016-11-03 | Discharge: 2016-11-03 | Disposition: A | Discharge status: Home / Self Care | Payer: Managed Care, Other (non HMO) | Source: Ambulatory Visit | Attending: Radiation Oncology | Admitting: Radiation Oncology

## 2016-11-03 DIAGNOSIS — Z510 Encounter for antineoplastic radiation therapy: Secondary | ICD-10-CM | POA: Diagnosis not present

## 2016-11-06 ENCOUNTER — Ambulatory Visit
Admission: RE | Admit: 2016-11-06 | Discharge: 2016-11-06 | Disposition: A | Discharge status: Home / Self Care | Payer: Managed Care, Other (non HMO) | Source: Ambulatory Visit | Attending: Radiation Oncology | Admitting: Radiation Oncology

## 2016-11-06 DIAGNOSIS — Z510 Encounter for antineoplastic radiation therapy: Secondary | ICD-10-CM | POA: Diagnosis not present

## 2016-11-07 ENCOUNTER — Ambulatory Visit
Admission: RE | Admit: 2016-11-07 | Discharge: 2016-11-07 | Disposition: A | Discharge status: Home / Self Care | Payer: Managed Care, Other (non HMO) | Source: Ambulatory Visit | Attending: Radiation Oncology | Admitting: Radiation Oncology

## 2016-11-07 DIAGNOSIS — Z510 Encounter for antineoplastic radiation therapy: Secondary | ICD-10-CM | POA: Diagnosis not present

## 2016-11-08 ENCOUNTER — Telehealth: Payer: Self-pay | Admitting: Oncology

## 2016-11-08 ENCOUNTER — Ambulatory Visit
Admission: RE | Admit: 2016-11-08 | Discharge: 2016-11-08 | Disposition: A | Discharge status: Home / Self Care | Payer: Managed Care, Other (non HMO) | Source: Ambulatory Visit | Attending: Radiation Oncology | Admitting: Radiation Oncology

## 2016-11-08 DIAGNOSIS — Z510 Encounter for antineoplastic radiation therapy: Secondary | ICD-10-CM | POA: Diagnosis not present

## 2016-11-08 NOTE — Telephone Encounter (Signed)
Returned call to pt to r/s and confirm 4/18 appt to 4/23 at 1030 am per staff msg

## 2016-11-09 ENCOUNTER — Ambulatory Visit
Admission: RE | Admit: 2016-11-09 | Discharge: 2016-11-09 | Disposition: A | Discharge status: Home / Self Care | Payer: Managed Care, Other (non HMO) | Source: Ambulatory Visit | Attending: Radiation Oncology | Admitting: Radiation Oncology

## 2016-11-09 DIAGNOSIS — Z510 Encounter for antineoplastic radiation therapy: Secondary | ICD-10-CM | POA: Diagnosis not present

## 2016-11-10 ENCOUNTER — Encounter: Payer: Self-pay | Admitting: Radiation Oncology

## 2016-11-10 ENCOUNTER — Ambulatory Visit
Admission: RE | Admit: 2016-11-10 | Discharge: 2016-11-10 | Disposition: A | Discharge status: Home / Self Care | Payer: Managed Care, Other (non HMO) | Source: Ambulatory Visit | Attending: Radiation Oncology | Admitting: Radiation Oncology

## 2016-11-10 VITALS — BP 127/77 | HR 64 | Temp 98.3°F | Resp 18 | Ht 70.0 in | Wt 178.2 lb

## 2016-11-10 DIAGNOSIS — Z510 Encounter for antineoplastic radiation therapy: Secondary | ICD-10-CM | POA: Diagnosis not present

## 2016-11-10 DIAGNOSIS — D0512 Intraductal carcinoma in situ of left breast: Secondary | ICD-10-CM

## 2016-11-10 NOTE — Progress Notes (Signed)
  Radiation Oncology         [phone]   Name: Jamie Gilbert MRN: [id number]   Date: 11/10/2016  DOB: 27-Sep-1955   Weekly Radiation Therapy Management    ICD-9-CM ICD-10-CM   1. Neoplasm of breast, primary tumor staging category tis: ductal carcinoma in situ (DCIS), left 233.0 D05.12     Current Dose: 30 Gy  Planned Dose:  60 Gy  Narrative The patient presents for routine under treatment assessment.  Jamie Gilbert presents for her 15th fraction of radiation to her Left Breast. She denies pain or fatigue. The nurse notes the patient's left breast is slightly red. She is using Radiaplex twice daily. The patient exercises by walking daily.  Set-up films were reviewed. The chart was checked.  Physical Findings  height is 5\' 10"  (1.778 m) and weight is 178 lb 3.2 oz (80.8 kg). Her oral temperature is 98.3 F (36.8 C). Her blood pressure is 127/77 and her pulse is 64. Her respiration is 18 and oxygen saturation is 100%.  Weight essentially stable. Erythema of the left breast with no desquamation.  Impression The patient is tolerating radiation.  Plan Continue treatment as planned.         Sheral Apley Tammi Klippel, M.D.  This document serves as a record of services personally performed by [name], MD. It was created on his behalf by Darcus Austin, a trained medical scribe. The creation of this record is based on the scribe's personal observations and the provider's statements to them. This document has been checked and approved by the attending provider.

## 2016-11-10 NOTE — Progress Notes (Signed)
Jamie Gilbert presents for her 15th fraction of radiation to her Left Breast. She denies pain or fatigue. She is walking daily. Her Left Breast is slightly red. She is using the Radiaplex twice daily.  Wt Readings from Last 3 Encounters:  11/10/16 178 lb 3.2 oz (80.8 kg)  11/02/16 181 lb 9.6 oz (82.4 kg)  10/26/16 182 lb (82.6 kg)  BP 127/77   Pulse 64   Temp 98.3 F (36.8 C) (Oral)   Resp 18   Ht 5\' 10"  (1.778 m)   Wt 178 lb 3.2 oz (80.8 kg)   SpO2 100%   BMI 25.57 kg/m

## 2016-11-13 ENCOUNTER — Ambulatory Visit
Admission: RE | Admit: 2016-11-13 | Discharge: 2016-11-13 | Disposition: A | Discharge status: Home / Self Care | Payer: Managed Care, Other (non HMO) | Source: Ambulatory Visit | Attending: Radiation Oncology | Admitting: Radiation Oncology

## 2016-11-13 DIAGNOSIS — Z510 Encounter for antineoplastic radiation therapy: Secondary | ICD-10-CM | POA: Diagnosis not present

## 2016-11-14 ENCOUNTER — Ambulatory Visit
Admission: RE | Admit: 2016-11-14 | Discharge: 2016-11-14 | Disposition: A | Discharge status: Home / Self Care | Payer: Managed Care, Other (non HMO) | Source: Ambulatory Visit | Attending: Radiation Oncology | Admitting: Radiation Oncology

## 2016-11-14 DIAGNOSIS — Z510 Encounter for antineoplastic radiation therapy: Secondary | ICD-10-CM | POA: Diagnosis not present

## 2016-11-15 ENCOUNTER — Ambulatory Visit
Admission: RE | Admit: 2016-11-15 | Discharge: 2016-11-15 | Disposition: A | Discharge status: Home / Self Care | Payer: Managed Care, Other (non HMO) | Source: Ambulatory Visit | Attending: Radiation Oncology | Admitting: Radiation Oncology

## 2016-11-15 DIAGNOSIS — Z510 Encounter for antineoplastic radiation therapy: Secondary | ICD-10-CM | POA: Diagnosis not present

## 2016-11-16 ENCOUNTER — Ambulatory Visit
Admission: RE | Admit: 2016-11-16 | Discharge: 2016-11-16 | Disposition: A | Discharge status: Home / Self Care | Payer: Managed Care, Other (non HMO) | Source: Ambulatory Visit | Attending: Radiation Oncology | Admitting: Radiation Oncology

## 2016-11-16 DIAGNOSIS — Z510 Encounter for antineoplastic radiation therapy: Secondary | ICD-10-CM | POA: Diagnosis not present

## 2016-11-16 DIAGNOSIS — D0512 Intraductal carcinoma in situ of left breast: Secondary | ICD-10-CM

## 2016-11-16 NOTE — Progress Notes (Signed)
  Radiation Oncology         [phone]   Name: Jamie Gilbert MRN: [id number]   Date: 11/16/2016  DOB: 1956-08-29   Weekly Radiation Therapy Management    ICD-9-CM ICD-10-CM   1. Neoplasm of breast, primary tumor staging category tis: ductal carcinoma in situ (DCIS), left 233.0 D05.12     Current Dose: 38 Gy  Planned Dose:  60 Gy  Narrative The patient presents for routine under treatment assessment.  She presents today on the linac table for electron set-up without any complaints  Set-up films were reviewed. The chart was checked.  Physical Findings NAD. Erythema of the left breast with no desquamation.  Impression The patient is tolerating radiation.  Plan Continue treatment as planned.         Sheral Apley Tammi Klippel, M.D.  This document serves as a record of services personally performed by [name], MD. It was created on his behalf by Bethann Humble, a trained medical scribe. The creation of this record is based on the scribe's personal observations and the provider's statements to them. This document has been checked and approved by the attending provider.

## 2016-11-17 ENCOUNTER — Ambulatory Visit
Admission: RE | Admit: 2016-11-17 | Discharge: 2016-11-17 | Disposition: A | Discharge status: Home / Self Care | Payer: Managed Care, Other (non HMO) | Source: Ambulatory Visit | Attending: Radiation Oncology | Admitting: Radiation Oncology

## 2016-11-17 DIAGNOSIS — Z510 Encounter for antineoplastic radiation therapy: Secondary | ICD-10-CM | POA: Diagnosis not present

## 2016-11-20 ENCOUNTER — Ambulatory Visit
Admission: RE | Admit: 2016-11-20 | Discharge: 2016-11-20 | Disposition: A | Discharge status: Home / Self Care | Payer: Managed Care, Other (non HMO) | Source: Ambulatory Visit | Attending: Radiation Oncology | Admitting: Radiation Oncology

## 2016-11-20 DIAGNOSIS — Z510 Encounter for antineoplastic radiation therapy: Secondary | ICD-10-CM | POA: Diagnosis not present

## 2016-11-21 ENCOUNTER — Ambulatory Visit
Admission: RE | Admit: 2016-11-21 | Discharge: 2016-11-21 | Disposition: A | Discharge status: Home / Self Care | Payer: Managed Care, Other (non HMO) | Source: Ambulatory Visit | Attending: Radiation Oncology | Admitting: Radiation Oncology

## 2016-11-21 DIAGNOSIS — Z510 Encounter for antineoplastic radiation therapy: Secondary | ICD-10-CM | POA: Diagnosis not present

## 2016-11-22 ENCOUNTER — Ambulatory Visit
Admission: RE | Admit: 2016-11-22 | Discharge: 2016-11-22 | Disposition: A | Discharge status: Home / Self Care | Payer: Managed Care, Other (non HMO) | Source: Ambulatory Visit | Attending: Radiation Oncology | Admitting: Radiation Oncology

## 2016-11-22 DIAGNOSIS — Z510 Encounter for antineoplastic radiation therapy: Secondary | ICD-10-CM | POA: Diagnosis not present

## 2016-11-23 ENCOUNTER — Ambulatory Visit
Admission: RE | Admit: 2016-11-23 | Discharge: 2016-11-23 | Disposition: A | Discharge status: Home / Self Care | Payer: Managed Care, Other (non HMO) | Source: Ambulatory Visit | Attending: Radiation Oncology | Admitting: Radiation Oncology

## 2016-11-23 DIAGNOSIS — Z510 Encounter for antineoplastic radiation therapy: Secondary | ICD-10-CM | POA: Diagnosis not present

## 2016-11-24 ENCOUNTER — Ambulatory Visit
Admission: RE | Admit: 2016-11-24 | Discharge: 2016-11-24 | Disposition: A | Discharge status: Home / Self Care | Payer: Managed Care, Other (non HMO) | Source: Ambulatory Visit | Attending: Radiation Oncology | Admitting: Radiation Oncology

## 2016-11-24 DIAGNOSIS — Z510 Encounter for antineoplastic radiation therapy: Secondary | ICD-10-CM | POA: Diagnosis not present

## 2016-11-27 ENCOUNTER — Ambulatory Visit
Admission: RE | Admit: 2016-11-27 | Discharge: 2016-11-27 | Disposition: A | Discharge status: Home / Self Care | Payer: Managed Care, Other (non HMO) | Source: Ambulatory Visit | Attending: Radiation Oncology | Admitting: Radiation Oncology

## 2016-11-27 DIAGNOSIS — Z510 Encounter for antineoplastic radiation therapy: Secondary | ICD-10-CM | POA: Diagnosis not present

## 2016-11-28 ENCOUNTER — Ambulatory Visit
Admission: RE | Admit: 2016-11-28 | Discharge: 2016-11-28 | Disposition: A | Discharge status: Home / Self Care | Payer: Managed Care, Other (non HMO) | Source: Ambulatory Visit | Attending: Radiation Oncology | Admitting: Radiation Oncology

## 2016-11-28 DIAGNOSIS — Z510 Encounter for antineoplastic radiation therapy: Secondary | ICD-10-CM | POA: Diagnosis not present

## 2016-11-29 ENCOUNTER — Ambulatory Visit
Admission: RE | Admit: 2016-11-29 | Discharge: 2016-11-29 | Disposition: A | Discharge status: Home / Self Care | Payer: Managed Care, Other (non HMO) | Source: Ambulatory Visit | Attending: Radiation Oncology | Admitting: Radiation Oncology

## 2016-11-29 DIAGNOSIS — Z510 Encounter for antineoplastic radiation therapy: Secondary | ICD-10-CM | POA: Diagnosis not present

## 2016-11-30 ENCOUNTER — Ambulatory Visit
Admission: RE | Admit: 2016-11-30 | Discharge: 2016-11-30 | Disposition: A | Discharge status: Home / Self Care | Payer: Managed Care, Other (non HMO) | Source: Ambulatory Visit | Attending: Radiation Oncology | Admitting: Radiation Oncology

## 2016-11-30 DIAGNOSIS — Z510 Encounter for antineoplastic radiation therapy: Secondary | ICD-10-CM | POA: Diagnosis not present

## 2016-11-30 DIAGNOSIS — D0512 Intraductal carcinoma in situ of left breast: Secondary | ICD-10-CM

## 2016-11-30 MED ORDER — RADIAPLEXRX EX GEL
Freq: Once | CUTANEOUS | Status: AC
  Administered 2016-11-30: 14:00:00 via TOPICAL

## 2016-12-01 ENCOUNTER — Telehealth: Payer: Self-pay | Admitting: *Deleted

## 2016-12-01 ENCOUNTER — Ambulatory Visit
Admission: RE | Admit: 2016-12-01 | Discharge: 2016-12-01 | Disposition: A | Discharge status: Home / Self Care | Payer: Managed Care, Other (non HMO) | Source: Ambulatory Visit | Attending: Radiation Oncology | Admitting: Radiation Oncology

## 2016-12-01 DIAGNOSIS — Z510 Encounter for antineoplastic radiation therapy: Secondary | ICD-10-CM | POA: Diagnosis not present

## 2016-12-01 NOTE — Telephone Encounter (Signed)
Left vm congratulating pt on completion of xrt. Contact information provided for questions or needs.

## 2016-12-06 ENCOUNTER — Telehealth: Payer: Self-pay | Admitting: Oncology

## 2016-12-06 NOTE — Telephone Encounter (Signed)
lvm to inform pt of SCP appt in June per LOS

## 2016-12-07 ENCOUNTER — Encounter: Payer: Self-pay | Admitting: Radiation Oncology

## 2016-12-07 NOTE — Progress Notes (Signed)
  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   Pt denied pain and fatigue throughout course of treatment. Pt experienced radiation related skin changes including hyperpigmentation with dry desquamation to the left breast and axilla.   Plan: The patient has completed radiation treatment. The patient will return to radiation oncology clinic for routine followup in one month. I advised her to call or return sooner if she has any questions or concerns related to her recovery or treatment. Pt will continue to use radiaplex as directed. ________________________________  Sheral Apley. Tammi Klippel, M.D.  This document serves as a record of services personally performed by [name], MD. It was created on his behalf by Maryla Morrow, a trained medical scribe. The creation of this record is based on the scribe's personal observations and the provider's statements to them. This document has been checked and approved by the attending provider.

## 2017-01-02 ENCOUNTER — Telehealth: Payer: Self-pay | Admitting: *Deleted

## 2017-01-02 NOTE — Telephone Encounter (Signed)
Called patient to alter fu on 01-10-17 to 01-18-17 @ 9:30 am , lvm for a return call

## 2017-01-03 ENCOUNTER — Ambulatory Visit: Payer: Managed Care, Other (non HMO) | Admitting: Oncology

## 2017-01-05 ENCOUNTER — Encounter: Payer: Self-pay | Admitting: Internal Medicine

## 2017-01-05 ENCOUNTER — Ambulatory Visit (INDEPENDENT_AMBULATORY_CARE_PROVIDER_SITE_OTHER): Payer: Managed Care, Other (non HMO) | Admitting: Internal Medicine

## 2017-01-05 VITALS — BP 138/82 | HR 76 | Temp 97.3°F | Ht 70.0 in | Wt 182.0 lb

## 2017-01-05 DIAGNOSIS — D0512 Intraductal carcinoma in situ of left breast: Secondary | ICD-10-CM | POA: Diagnosis not present

## 2017-01-05 DIAGNOSIS — Z87891 Personal history of nicotine dependence: Secondary | ICD-10-CM | POA: Diagnosis not present

## 2017-01-08 ENCOUNTER — Telehealth: Payer: Self-pay

## 2017-01-08 ENCOUNTER — Ambulatory Visit (HOSPITAL_BASED_OUTPATIENT_CLINIC_OR_DEPARTMENT_OTHER): Payer: 59 | Admitting: Oncology

## 2017-01-08 VITALS — BP 133/63 | HR 81 | Temp 98.0°F | Resp 18 | Ht 70.0 in | Wt 181.5 lb

## 2017-01-08 DIAGNOSIS — Z720 Tobacco use: Secondary | ICD-10-CM

## 2017-01-08 DIAGNOSIS — D0512 Intraductal carcinoma in situ of left breast: Secondary | ICD-10-CM | POA: Diagnosis not present

## 2017-01-08 DIAGNOSIS — Z170 Estrogen receptor positive status [ER+]: Secondary | ICD-10-CM | POA: Diagnosis not present

## 2017-01-08 MED ORDER — ANASTROZOLE 1 MG PO TABS: 1.0000 mg | ORAL_TABLET | Freq: Every day | ORAL | 4 refills | Status: DC

## 2017-01-08 NOTE — Telephone Encounter (Signed)
Patient called to let you know that she met with Dr. Jana Hakim and she was told that she's now cancer free and he will not prescribe tamoxifen he's going to prescribe something else " she can't remember the name of it"  and to thank you for everything you've done for her and all of your support.

## 2017-01-08 NOTE — Telephone Encounter (Signed)
Please call her. I am sending her his report and she should read this carefully. Especially the part about Tamoxifen.

## 2017-01-08 NOTE — Progress Notes (Signed)
[facility]  [phone] [phone]) [phone]     ID: Jamie Gilbert DOB: [birth year]  MR#: [id number]  XIP#:[id number]  Patient Care Team: Elby Showers, MD as PCP - General (Internal Medicine) [name], MD as Consulting Physician (Oncology) [name], MD as Consulting Physician (General Surgery) [name], MD as Consulting Physician (Radiation Oncology) [name], MD OTHER MD:  CHIEF COMPLAINT: Ductal carcinoma in situ  CURRENT TREATMENT:  anastrozole   BREAST CANCER HISTORY:  from the original intake note:  The patient had bilateral screening mammography with tomography at the [facility] 06/08/2016. There were calcifications in the left breast which were further evaluated 08/14/2016 with left unilateral diagnostic mammography. The breast density was category B. There was a developing group of heterogeneous calcifications in the left breast lateral to the nipple at the 2:00 position spanning 2 cm. 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  The patient's subsequent history is as detailed below  INTERVAL HISTORY: [place] today for follow-up of her noninvasive breast cancer. Since her last visit with me she underwent definitive surgery, namely left lumpectomy on 09/29/2016. The final pathology ([id number]) showed ductal carcinoma in situ, grade 2, with ample margins.   She then proceeded to adjuvant radiation under Dr. Tammi Klippel she tolerated that well, but found it T the superior in particular she stated being restricted in terms of her activity level.She feels very ready to go back to the gym and yard work.  Incidentally she never did discontinue smoking even for a few weeks prior to her surgery or radiation as she had been advised. She does say she is cutting back  on her cigarettes.  REVIEW OF SYSTEMS: She continues to do quite a bit of traveling. A detailed review of systems today was otherwise entirely noncontributory   PAST MEDICAL HISTORY: Past Medical History:  Diagnosis Date  . Breast cancer ([place])    DCIS  . Hyperlipidemia   . PONV (postoperative nausea and vomiting)   . Vitamin D deficiency     PAST SURGICAL HISTORY: Past Surgical History:  Procedure Laterality Date  . BREAST LUMPECTOMY WITH RADIOACTIVE SEED LOCALIZATION Left 09/29/2016   Procedure: LEFT BREAST LUMPECTOMY WITH RADIOACTIVE SEED LOCALIZATION;  Surgeon: [name], MD;  Location: [facility];  Service: General;  Laterality: Left;  . ECTOPIC PREGNANCY SURGERY    . medial meniscus repair     left knee w/plica excised  . TUBAL LIGATION      FAMILY HISTORY Family History  Problem Relation Age of Onset  . Hypertension Mother   . Aortic aneurysm Mother   . Brain cancer Father   . Prostate cancer Brother 56  The patient's father died from a brain tumor at age 65. The patient's mother died from a ruptured aortic aneurysm at age 38. The patient has 3 brothers, no sisters. There is no history of breast or ovarian cancer in the family to her knowledge  GYNECOLOGIC HISTORY:  No LMP recorded. Patient is postmenopausal. Menarche age 13, first live birth age 61. She is GX P2. She went through the change of life in 2008. She did not use hormone replacement. She never used oral contraceptives.  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 Her husband Jamie Gilbert is an Chief Financial Officer. He does a lot of traveling. Daughter Gloriajean Dell lives in [place] currently unemployed but had  been working in Scientist, research (medical). Daughter Dorann Lodge lives in [place] where she is a Agricultural engineer. The patient has no grandchildren. She is not a church attender    ADVANCED DIRECTIVES: The patient has a living will in place. Her husband is her healthcare power of attorney   HEALTH  MAINTENANCE: Social History  Substance Use Topics  . Smoking status: Current Some Day Smoker    Packs/day: 1.50    Years: 25.00    Types: Cigarettes, E-cigarettes  . Smokeless tobacco: Current User  . Alcohol use 12.6 oz/week    14 Standard drinks or equivalent, 7 Glasses of wine per week     Colonoscopy: More than 10 years ago  PAP:  Bone density: 2012(?)--Osteopenia per patient report   Allergies  Allergen Reactions  . Eggs Or Egg-Derived Products Nausea And Vomiting    Current Outpatient Prescriptions  Medication Sig Dispense Refill  . anastrozole (ARIMIDEX) 1 MG tablet Take 1 tablet (1 mg total) by mouth daily. 90 tablet 4  . ergocalciferol (VITAMIN D2) 50000 units capsule Take 1 capsule (50,000 Units total) by mouth once a week. 4 capsule 2  . ibuprofen (ADVIL,MOTRIN) 800 MG tablet TAKE (1) TABLET EVERY EIGHT HOURS AS NEEDED FOR PAIN. 90 tablet 3  . rosuvastatin (CRESTOR) 5 MG tablet Take 1 tablet (5 mg total) by mouth daily. 90 tablet 1   No current facility-administered medications for this visit.     OBJECTIVE: Middle-aged white woman  Who appears well  Vitals:   01/08/17 1118  BP: 133/63  Pulse: 81  Resp: 18  Temp: 98 F (36.7 C)     Body mass index is 26.04 kg/m.    ECOG FS:0 - Asymptomatic  Sclerae unicteric, pupils round and equal Oropharynx clear and moist No cervical or supraclavicular adenopathy Lungs no rales or rhonchi Heart regular rate and rhythm Abd soft, nontender, positive bowel sounds MSK no focal spinal tenderness, no upper extremity lymphedema Neuro: nonfocal, well oriented, appropriate affect Breasts:  The right breast is benign. The left breast is undergone lumpectomy and radiation. The cosmetic result is good. There is still a minimal duskiness on the skin, but no desquamation and no suspicious masses. The appearance of the left nipple is slightly different as compared to the right. Both axillae are benign.   LAB RESULTS:  CMP      Component Value Date/Time   NA 141 09/29/2016 1545   NA 140 09/05/2016 1540   K 3.5 09/29/2016 1545   K 3.8 09/05/2016 1540   CL 109 09/29/2016 1545   CO2 24 09/29/2016 1545   CO2 26 09/05/2016 1540   GLUCOSE 84 09/29/2016 1545   GLUCOSE 102 09/05/2016 1540   BUN 8 09/29/2016 1545   BUN 11.3 09/05/2016 1540   CREATININE 0.68 09/29/2016 1545   CREATININE 1.1 09/05/2016 1540   CALCIUM 9.3 09/29/2016 1545   CALCIUM 9.4 09/05/2016 1540   PROT 7.2 09/05/2016 1540   ALBUMIN 3.9 09/05/2016 1540   AST 18 09/05/2016 1540   ALT 16 09/05/2016 1540   ALKPHOS 60 09/05/2016 1540   BILITOT <0.22 09/05/2016 1540   GFRNONAA >60 09/29/2016 1545   GFRNONAA 85 06/10/2015 0916   GFRAA >60 09/29/2016 1545   GFRAA >89 06/10/2015 0916    INo results found for: SPEP, UPEP  Lab Results  Component Value Date   WBC 5.9 09/29/2016   NEUTROABS 4.6 09/05/2016   HGB 12.5 09/29/2016   HCT 37.5 09/29/2016   MCV 95.4 09/29/2016  PLT 314 09/29/2016      Chemistry      Component Value Date/Time   NA 141 09/29/2016 1545   NA 140 09/05/2016 1540   K 3.5 09/29/2016 1545   K 3.8 09/05/2016 1540   CL 109 09/29/2016 1545   CO2 24 09/29/2016 1545   CO2 26 09/05/2016 1540   BUN 8 09/29/2016 1545   BUN 11.3 09/05/2016 1540   CREATININE 0.68 09/29/2016 1545   CREATININE 1.1 09/05/2016 1540      Component Value Date/Time   CALCIUM 9.3 09/29/2016 1545   CALCIUM 9.4 09/05/2016 1540   ALKPHOS 60 09/05/2016 1540   AST 18 09/05/2016 1540   ALT 16 09/05/2016 1540   BILITOT <0.22 09/05/2016 1540       No results found for: LABCA2  No components found for: LABCA125  No results for input(s): INR in the last 168 hours.  Urinalysis    Component Value Date/Time   BILIRUBINUR neg 06/15/2016 1029   PROTEINUR neg 06/15/2016 1029   UROBILINOGEN negative 06/15/2016 1029   NITRITE neg 06/15/2016 1029   LEUKOCYTESUR Negative 06/15/2016 1029     STUDIES:  Review of her November 2017 mammogram  shows the breast density to be category B.  ELIGIBLE FOR AVAILABLE RESEARCH PROTOCOL: decided against COMET trial  ASSESSMENT: 61 y.o. [place] woman status post left breast biopsy 08/17/2016 for ductal carcinoma in situ, grade 2, estrogen and progesterone receptor positive.    (1)  Status post left lumpectomy 09/29/2016 for ductal carcinoma in situ, grade 2, measuring 1.1 cm, with ample margins   (2) adjuvant radiation 10/23/16 - 12/01/16 Site/dose:   Left breast treated to 50 Gy in 25 fx of 2 Gy                      Left breast boosted to 60 Gy in 5 fx of 2 Gy  (3)  Anastrozole started 01/08/2017  (4) continuing tobacco abuse  PLAN: I spent approximately 30 minutes with Reiana with most of that time spent reviewing her diagnosis and prognosis. She understands her cancer was noninvasive and therefore not life-threatening. With the surgery and radiation she had for local treatment, I would estimate her risk of recurrence in the ipsilateral breast (the only place this cancer can recur) is 10% or less.  On the other hand the risk of her developing another breast cancer in either breast approaches 1%..  If she takes antiestrogens for 5 years, both those risks would be cut in half.  We Thendiscussed the difference between tamoxifen and anastrozole in detail. She understands that anastrozole and the aromatase inhibitors in general work by blocking estrogen production. Accordingly vaginal dryness, decrease in bone density, and of course hot flashes can result. The aromatase inhibitors can also negatively affect the cholesterol profile, although that is a minor effect. One out of 5 women on aromatase inhibitors we will feel "old and achy". This arthralgia/myalgia syndrome, which resembles fibromyalgia clinically, does resolve with stopping the medications. Accordingly this is not a reason to not try an aromatase inhibitor but it is a frequent reason to stop it (in other words 20% of  women will not be able to tolerate these medications).  Tamoxifen on the other hand does not block estrogen production. It does not "take away a woman's estrogen". It blocks the estrogen receptor in breast cells. Like anastrozole, it can also cause hot flashes. As opposed to anastrozole, tamoxifen has many estrogen-like effects. It  is technically an estrogen receptor modulator. This means that in some tissues tamoxifen works like estrogen-- for example it helps strengthen the bones. It tends to improve the cholesterol profile. It can cause thickening of the endometrial lining, and even endometrial polyps or rarely cancer of the uterus.(The risk of uterine cancer due to tamoxifen is one additional cancer per thousand women year). It can cause vaginal wetness or stickiness. It can cause blood clots through this estrogen-like effect--the risk of blood clots with tamoxifen is exactly the same as with birth control pills or hormone replacement.  Neither of these agents causes mood changes or weight gain, despite the popular belief that they can have these side effects. We have data from studies comparing either of these drugs with placebo, and in those cases the control group had the same amount of weight gain and depression as the group that took the drug.  Given all this discussion we decided to give anastrozole a try. I went ahead and wrote the prescription for her and she will see me in 2-3 months. If she is tolerating anastrozole well I will start seeing her on a once a year basis.  If she does not tolerate anastrozole well we can either switch to tamoxifen, consider exemestane, or simply proceed with observation  I again encourage Lenetta to consider discontinuing smoking.  She knows to call for any problems that may develop before her next visit here.    [name], MD   01/08/2017 11:42 AM Medical Oncology and Hematology [facility] [address] [place], [place]  [address] Tel. [phone]    Fax. [phone]

## 2017-01-13 NOTE — Progress Notes (Signed)
   Subjective:    Patient ID: Marcine Matar, female    DOB: [birth year], 61 y.o.   MRN: [id number]  HPI 61 year old female diagnosed with left ductal carcinoma in situ, grade 2, estrogen receptor 80% positive, progesterone receptor 10% positive.  She had lumpectomy January 2018.  She has completed adjuvant radiation with Dr. Tammi Klippel.  Has been a long winter for her and she's not been able to get out very much. Looking forward to doing yard work.  Continues to smoke.  She has 2 daughters from a previous marriage. Nunzio Cory lives in [place] and Maplewood lives in [place].  History of vitamin D deficiency and hyperlipidemia.  She has some questions about taking antiestrogen therapy. It has been recommended she try Isanti decks 1 mg daily.    Review of Systems see above     Objective:   Physical Exam  Not examined. 20 minute discussion.      Assessment & Plan:  Ductal carcinoma in situ, grade 2, estrogen and progesterone receptors positive  Patient has some concerns about anti- estrogen therapy. Explained to her that I suspected taking antiestrogen therapy would reduce her chance of recurrence and that would most likely be worthwhile  in my opinion. She agrees discuss this further with Dr. Randell Patient not who is clearly the expert in this manner. I would defer to his decision.  Otherwise she needs to spend time getting healthy and stopping smoking.

## 2017-01-13 NOTE — Patient Instructions (Addendum)
Please stop smoking. Continue diet and exercise regimen. Consider antiestrogen therapy. Physical exam due after September 28.

## 2017-01-16 NOTE — Progress Notes (Signed)
Jamie Gilbert 61 yo woman with DCIS  comedonecrosis with margin  upper outer quadrant of the left breast - Stage Zero,radiation completed 12-01-16, one month FU.  Skin status:Left breast with mild hyperpigmentation,skin intact,mild swelling noticed What lotion are you using? Using Radiaplex gel, plans to get lotion with vitamin E to use Have you seen med onc? If not, when is appointment:01-08-17 Dr. Marjie Skiff we decided to give anastrozole a try. I went ahead and wrote the prescription for her and she will see me in 2-3 months. If she is tolerating anastrozole well I will start seeing her on a once a year basis If they are ER+, have they started Al or Tamoxifen? If not, why? 01-08-17 Anastrozle Discuss survivorship appointment:03-16-17 Jamie Gilbert, N.P. Have you had a mammogram scheduled? Not scheduled yet Offer referral to [facility]. Will receive at the survivorship appointment.03-16-17 Appetite:Good eating two meals per day with snacks Pain:None Fatigue:Reports fatigue at night, going for walks during the day. Arm mobility:Raising left arm without difficulty. Lymphedema:None Wt Readings from Last 3 Encounters:  01/18/17 180 lb 3.2 oz (81.7 kg)  01/08/17 181 lb 8 oz (82.3 kg)  01/05/17 182 lb (82.6 kg)  BP 131/88   Pulse 65   Temp 98.3 F (36.8 C) (Oral)   Resp 18   Ht 5\' 10"  (1.778 m)   Wt 180 lb 3.2 oz (81.7 kg)   SpO2 100%   BMI 25.86 kg/m

## 2017-01-17 NOTE — Progress Notes (Signed)
Radiation Oncology         [phone] ________________________________  Name: Batsheva Stevick MRN: [id number]  Date: 01/18/2017  DOB: 04-23-1956  Post Treatment Note  CC: Elby Showers, MD  Elby Showers, MD  Diagnosis:   Left breast DCIS, grade 2, ER(+) PR(+)  Interval Since Last Radiation:  6 weeks  10/23/16 - 12/01/16:    Left breast treated to 50 Gy in 25 fx of 2 Gy Left breast boosted to 60 Gy in 5 fx of 2 Gy   Narrative:  The patient returns today for routine follow-up. During treatment she did relatively well with radiotherapy.  She did experience mild hyperpigmentation and dry desquamation to the left breast and axilla.                             On review of systems, the patient states that she is doing very well and is currently without complaints.  She has noticed significant improvement in the hyperpigmentation of the left breast.  She denies breast pain, swelling, drainage from her incision site or nipple discharge.  Her energy is gradually returning and she is back to walking regularly for exercise. She reports a good appetite and is maintaing her weight.  She denies abdominal pain, N/V/D.  Denies shortness of breath, chest pain, fever, chills or night sweats.  She recently started on Anastrazole under the direction of Dr. Jana Hakim.  She is tolerating this well.  She has a follow up appointment scheduled with Dr. Jana Hakim on 04/13/17.  ALLERGIES:  is allergic to eggs or egg-derived products.  Meds: Current Outpatient Prescriptions  Medication Sig Dispense Refill  . anastrozole (ARIMIDEX) 1 MG tablet Take 1 tablet (1 mg total) by mouth daily. 90 tablet 4  . ergocalciferol (VITAMIN D2) 50000 units capsule Take 1 capsule (50,000 Units total) by mouth once a week. 4 capsule 2  . rosuvastatin (CRESTOR) 5 MG tablet Take 1 tablet (5 mg total) by mouth daily. 90 tablet 1  . ibuprofen (ADVIL,MOTRIN) 800 MG tablet TAKE (1) TABLET EVERY EIGHT HOURS AS NEEDED FOR PAIN. (Patient  not taking: Reported on 01/18/2017) 90 tablet 3   No current facility-administered medications for this encounter.     Physical Findings:  height is 5\' 10"  (1.778 m) and weight is 180 lb 3.2 oz (81.7 kg). Her oral temperature is 98.3 F (36.8 C). Her blood pressure is 131/88 and her pulse is 65. Her respiration is 18 and oxygen saturation is 100%.  Pain Assessment Pain Score: 0-No pain/10 In general this is a well appearing caucasian female in no acute distress. She's alert and oriented x4 and appropriate throughout the examination. Cardiopulmonary assessment is negative for acute distress and she exhibits normal effort. The left breast was examined and reveals only minimal residual hyperpigmentation.  Incisions are well healed and without signs of infection.  No nipple discharge and no drainage from the incisions.  Lab Findings: Lab Results  Component Value Date   WBC 5.9 09/29/2016   HGB 12.5 09/29/2016   HCT 37.5 09/29/2016   MCV 95.4 09/29/2016   PLT 314 09/29/2016     Radiographic Findings: No results found.  Impression/Plan: 1. Left breast DCIS, grade 2, ER(+) PR(+). The patient has been doing well since completion of radiotherapy. We discussed that we would be happy to continue to follow her as needed, but she will have routine follow up with Dr. Jana Hakim in medical oncology. She  was counseled on skin care as well as measures to avoid sun exposure to this area.  2. Survivorship. She has a scheduled appointment with Thedore Mins, NP on 03/16/17 which she was encouraged to keep.    Nicholos Johns, PA-C

## 2017-01-18 ENCOUNTER — Ambulatory Visit
Admission: RE | Admit: 2017-01-18 | Discharge: 2017-01-18 | Disposition: A | Discharge status: Home / Self Care | Payer: 59 | Source: Ambulatory Visit | Attending: Radiation Oncology | Admitting: Radiation Oncology

## 2017-01-18 ENCOUNTER — Encounter: Payer: Self-pay | Admitting: Urology

## 2017-01-18 VITALS — BP 131/88 | HR 65 | Temp 98.3°F | Resp 18 | Ht 70.0 in | Wt 180.2 lb

## 2017-01-18 DIAGNOSIS — Z79899 Other long term (current) drug therapy: Secondary | ICD-10-CM | POA: Insufficient documentation

## 2017-01-18 DIAGNOSIS — F17200 Nicotine dependence, unspecified, uncomplicated: Secondary | ICD-10-CM | POA: Insufficient documentation

## 2017-01-18 DIAGNOSIS — Z170 Estrogen receptor positive status [ER+]: Secondary | ICD-10-CM | POA: Insufficient documentation

## 2017-01-18 DIAGNOSIS — Z510 Encounter for antineoplastic radiation therapy: Secondary | ICD-10-CM | POA: Diagnosis present

## 2017-01-18 DIAGNOSIS — D0512 Intraductal carcinoma in situ of left breast: Secondary | ICD-10-CM | POA: Diagnosis not present

## 2017-01-18 NOTE — Addendum Note (Signed)
Encounter addended by: Malena Edman, RN on: 01/18/2017 10:45 AM<BR>    Actions taken: Charge Capture section accepted

## 2017-01-25 IMAGING — MG NEEDLE LOCALIZATION OF THE LEFT BREAST WITH MAMMO GUIDANCE
5 series · 5 of 5 positions shown · non-contrast
Comparison: Previous exam(s).

CLINICAL DATA: Patient presents for seed localization prior to
lumpectomy for DCIS.

EXAM:
MAMMOGRAPHIC GUIDED RADIOACTIVE SEED LOCALIZATION OF THE LEFT BREAST

[L CC (1 of 3)]
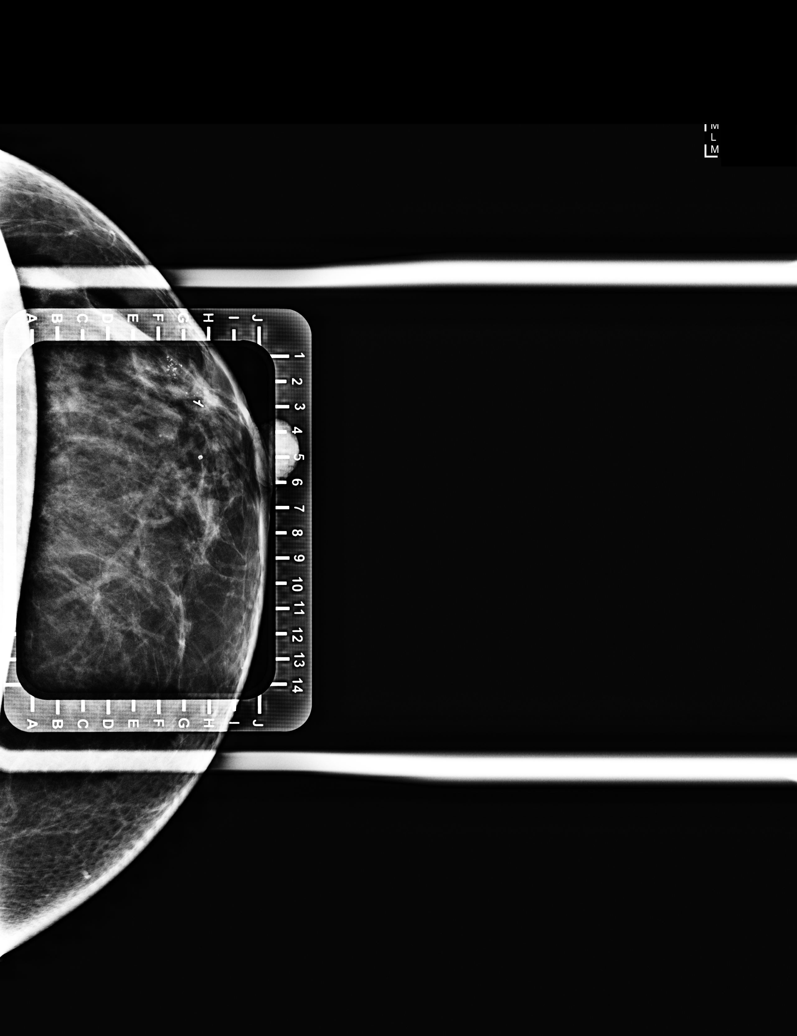

[L CC (2 of 3)]
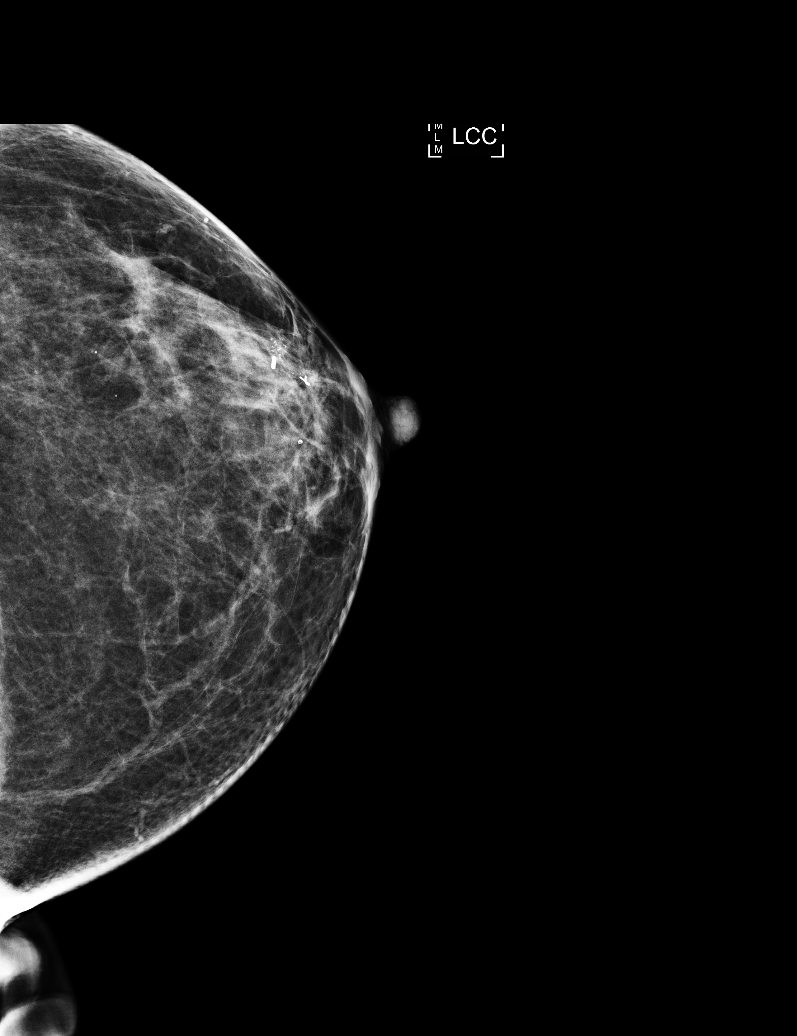

[L ML (1 of 2)]
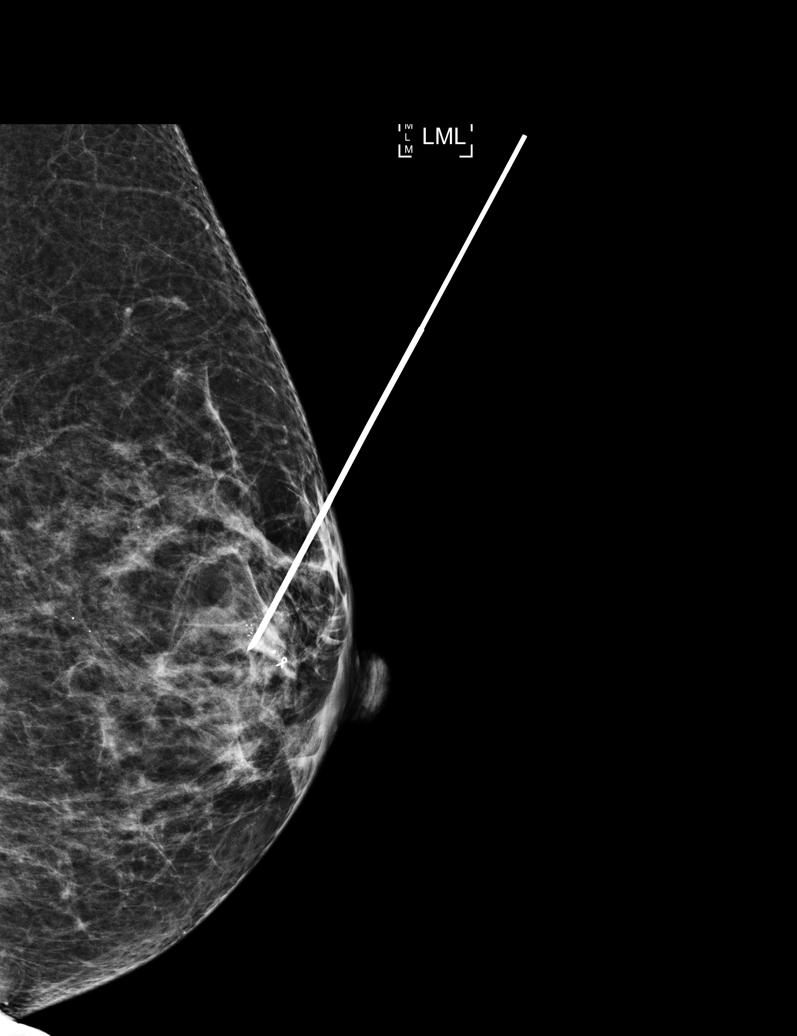

[L CC (3 of 3)]
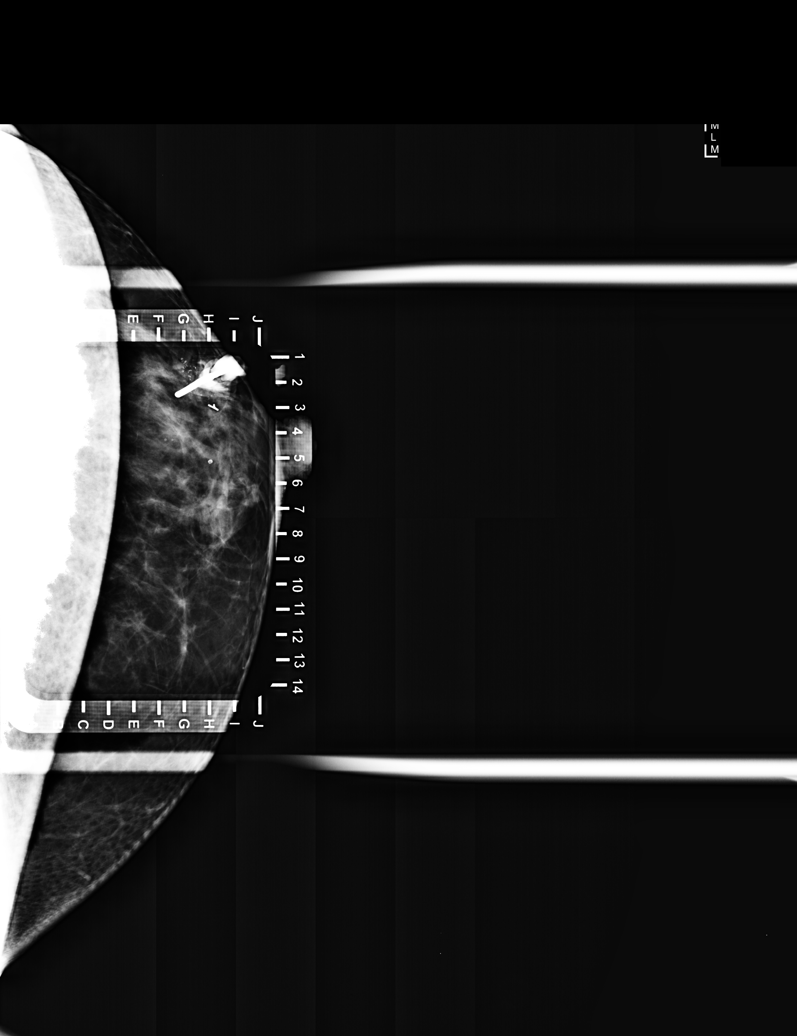

[L ML (2 of 2)]
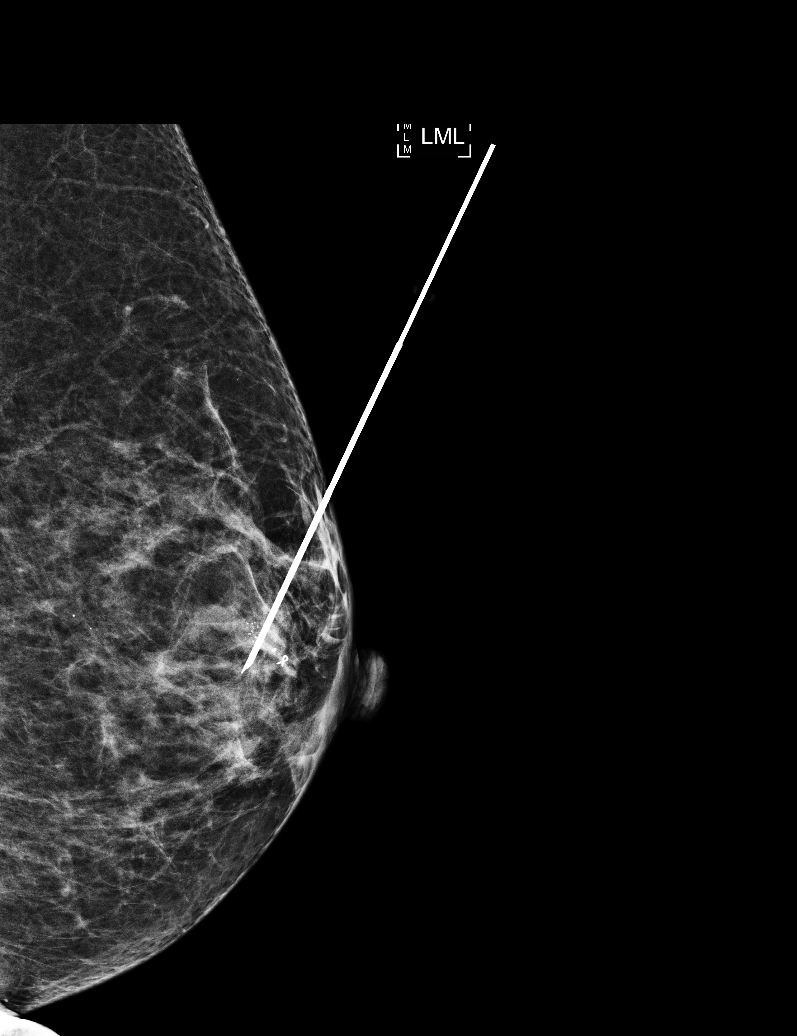

[5 of 5 positions shown; findings below may reference images not displayed]

FINDINGS: Patient presents for radioactive seed localization prior to
lumpectomy. I met with the patient and we discussed the procedure of
seed localization including benefits and alternatives. We discussed
the high likelihood of a successful procedure. We discussed the
risks of the procedure including infection, bleeding, tissue injury
and further surgery. We discussed the low dose of radioactivity
involved in the procedure. Informed, written consent was given.

The usual time-out protocol was performed immediately prior to the
procedure.

Using mammographic guidance, sterile technique, 1% lidocaine and an
2-B3N radioactive seed, residual calcifications and ribbon shaped
clip in the upper outer quadrant of the left breast are localized
using a cephalad approach. The follow-up mammogram images confirm
the seed in the expected location and were marked for Dr. Jim.

Follow-up survey of the patient confirms presence of the radioactive
seed.

Order number of [id number] seed:  [id number].

Total activity:  0.252 millicurie  Reference Date: 09/04/2016

The patient tolerated the procedure well and was released from the
[REDACTED]. She was given instructions regarding seed removal.
IMPRESSION: Radioactive seed localization of the left breast. No apparent
complications.

## 2017-01-27 IMAGING — MG BREAST SURGICAL SPECIMEN
1 series · 1 of 1 positions shown · non-contrast
Comparison: Previous exam(s).

CLINICAL DATA: Patient is post radioactive seed localization and
subsequent surgical excision of biopsy proven DCIS left retroareolar
region.

EXAM:
SPECIMEN RADIOGRAPH OF THE left BREAST

[L]
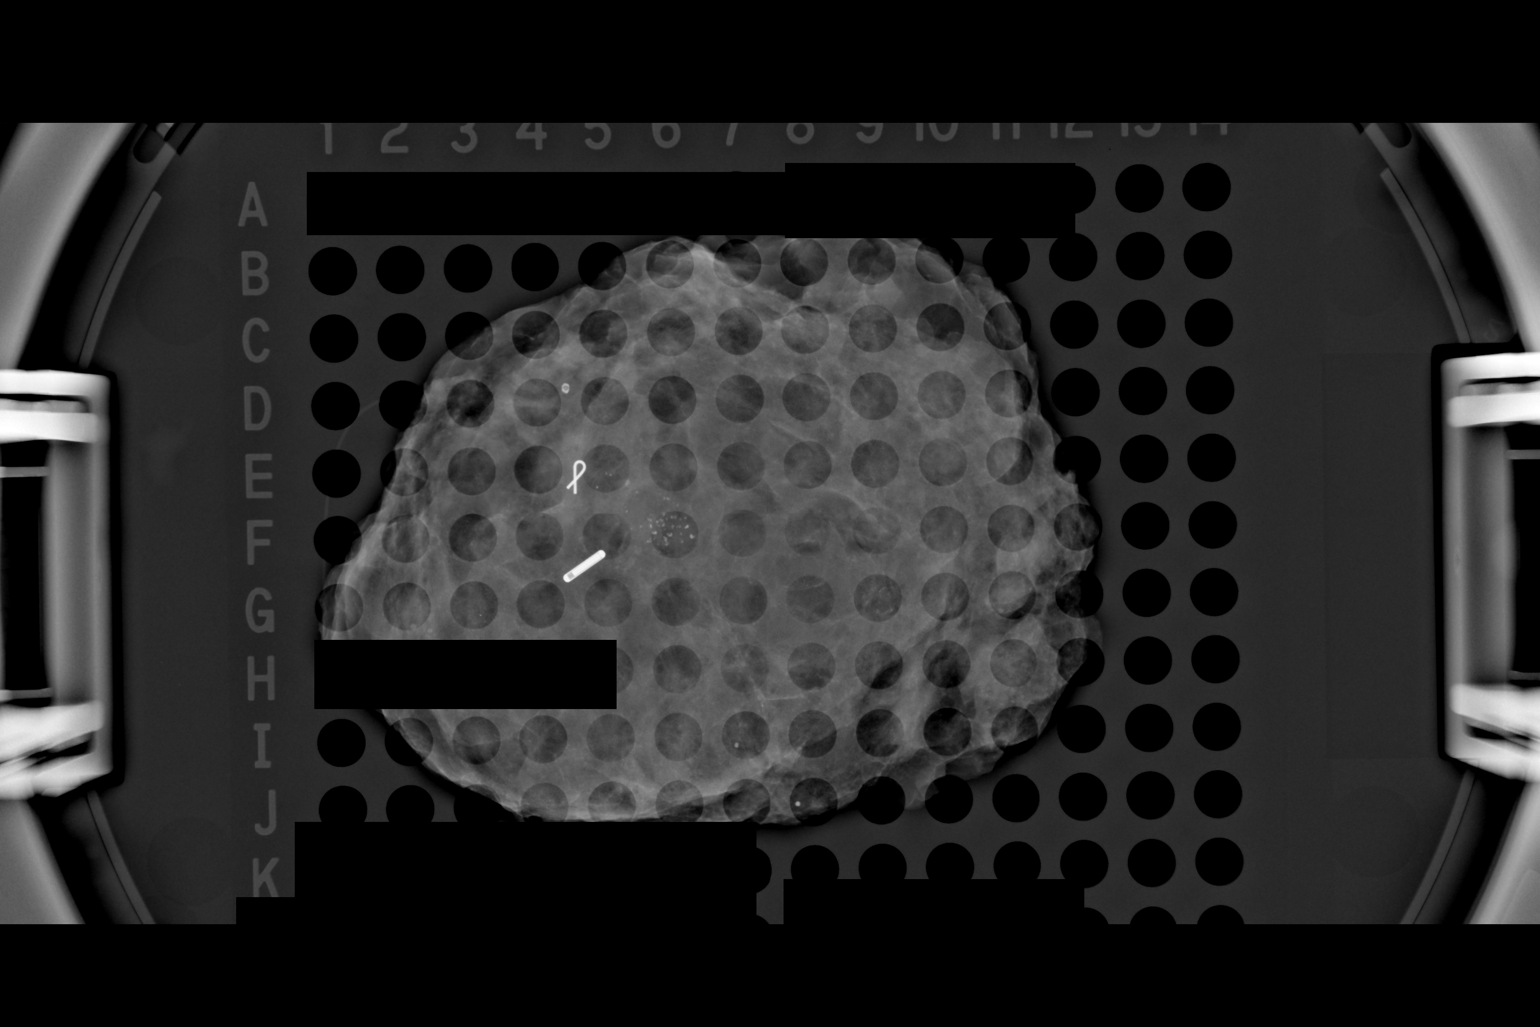

[1 of 1 positions shown; findings below may reference images not displayed]

FINDINGS: Status post excision of the left breast. The radioactive seed and
biopsy marker clip as well as targeted microcalcifications are
present in the specimen, completely intact, and were marked for
pathology. Findings were called to the OR at the time of dictation.
IMPRESSION: Specimen radiograph of the left breast.

## 2017-03-16 ENCOUNTER — Encounter: Payer: Managed Care, Other (non HMO) | Admitting: Adult Health

## 2017-04-09 NOTE — Progress Notes (Signed)
[place]  [phone]) [phone] Fax:(336) [phone]     ID: Jamie Gilbert DOB: 09-23-1955  MR#: [id number]  JQB#:[id number]  Patient Care Team: Elby Showers, MD as PCP - General (Internal Medicine) Mirissa Lopresti, Virgie Dad, MD as Consulting Physician (Oncology) [name], MD as Consulting Physician (General Surgery) [name], MD as Consulting Physician (Radiation Oncology) [name], MD OTHER MD:  CHIEF COMPLAINT: Ductal carcinoma in situ  CURRENT TREATMENT:  Exemestane   BREAST CANCER HISTORY:  from the original intake note:  The patient had bilateral screening mammography with tomography at the [facility] 06/08/2016. There were calcifications in the left breast which were further evaluated 08/14/2016 with left unilateral diagnostic mammography. The breast density was category B. There was a developing group of heterogeneous calcifications in the left breast lateral to the nipple at the 2:00 position spanning 2 cm. 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  The patient's subsequent history is as detailed below  INTERVAL HISTORY: Prim returns today for follow-up and treatment of her ductal carcinoma in situ. She started anastrozole but after a month she tolerated it very poorly. She stopped it and felt much better within a few weeks. She has no interest in going back to that drug.  The good news that she quit smoking 2 weeks ago.  REVIEW OF SYSTEMS: She just returned from a convention in [place] when there is a [employer] cabin crew. 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 She remains very active. She otherwise does not exercise regularly. A detailed review of systems today was stable   PAST MEDICAL HISTORY: Past Medical History:  Diagnosis  Date  . Breast cancer ([place])    DCIS  . Hyperlipidemia   . PONV (postoperative nausea and vomiting)   . Vitamin D deficiency     PAST SURGICAL HISTORY: Past Surgical History:  Procedure Laterality Date  . BREAST LUMPECTOMY WITH RADIOACTIVE SEED LOCALIZATION Left 09/29/2016   Procedure: LEFT BREAST LUMPECTOMY WITH RADIOACTIVE SEED LOCALIZATION;  Surgeon: [name], MD;  Location: [facility];  Service: General;  Laterality: Left;  . ECTOPIC PREGNANCY SURGERY    . medial meniscus repair     left knee w/plica excised  . TUBAL LIGATION      FAMILY HISTORY Family History  Problem Relation Age of Onset  . Hypertension Mother   . Aortic aneurysm Mother   . Brain cancer Father   . Prostate cancer Brother 57  The patient's father died from a brain tumor at age 62. The patient's mother died from a ruptured aortic aneurysm at age 62. The patient has 3 brothers, no sisters. There is no history of breast or ovarian cancer in the family to her knowledge  GYNECOLOGIC HISTORY:  No LMP recorded. Patient is postmenopausal. Menarche age 61, first live birth age 61. She is GX P2. She went through the change of life in 2008. She did not use hormone replacement. She never used oral contraceptives.  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 Her husband Delfino Lovett is an Chief Financial Officer. He does a lot of traveling. Daughter Gloriajean Dell lives in [place] currently unemployed but had been working in Scientist, research (medical). Daughter Dorann Lodge lives in [place] where she is a Agricultural engineer. The patient has no grandchildren. She is not a church attender    ADVANCED DIRECTIVES: The patient  has a living will in place. Her husband is her healthcare power of attorney   HEALTH MAINTENANCE: Social History  Substance Use Topics  . Smoking status: Current Some Day Smoker    Packs/day: 1.50    Years: 25.00    Types: Cigarettes, E-cigarettes  . Smokeless tobacco: Current User  . Alcohol use 12.6 oz/week    14  Standard drinks or equivalent, 7 Glasses of wine per week     Colonoscopy: More than 10 years ago  PAP:  Bone density: 2012(?)--Osteopenia per patient report   Allergies  Allergen Reactions  . Eggs Or Egg-Derived Products Nausea And Vomiting    Current Outpatient Prescriptions  Medication Sig Dispense Refill  . ergocalciferol (VITAMIN D2) 50000 units capsule Take 1 capsule (50,000 Units total) by mouth once a week. 4 capsule 2  . exemestane (AROMASIN) 25 MG tablet Take 1 tablet (25 mg total) by mouth daily after breakfast. 90 tablet 4  . rosuvastatin (CRESTOR) 5 MG tablet Take 1 tablet (5 mg total) by mouth daily. 90 tablet 1   No current facility-administered medications for this visit.     OBJECTIVE: Middle-aged white womanIn no acute distress  Vitals:   04/10/17 1120  BP: 131/75  Pulse: 65  Resp: 18  Temp: 98.7 F (37.1 C)     Body mass index is 25.8 kg/m.    ECOG FS:0 - Asymptomatic  Sclerae unicteric, EOMs intact Oropharynx clear and moist No cervical or supraclavicular adenopathy Lungs no rales or rhonchi Heart regular rate and rhythm Abd soft, nontender, positive bowel sounds MSK no focal spinal tenderness, no upper extremity lymphedema Neuro: nonfocal, well oriented, appropriate affect Breasts: The right breast is unremarkable. The left breast is undergone lumpectomy and radiation. There is no evidence of local recurrence. Both axillae are benign.  LAB RESULTS:  CMP     Component Value Date/Time   NA 141 09/29/2016 1545   NA 140 09/05/2016 1540   K 3.5 09/29/2016 1545   K 3.8 09/05/2016 1540   CL 109 09/29/2016 1545   CO2 24 09/29/2016 1545   CO2 26 09/05/2016 1540   GLUCOSE 84 09/29/2016 1545   GLUCOSE 102 09/05/2016 1540   BUN 8 09/29/2016 1545   BUN 11.3 09/05/2016 1540   CREATININE 0.68 09/29/2016 1545   CREATININE 1.1 09/05/2016 1540   CALCIUM 9.3 09/29/2016 1545   CALCIUM 9.4 09/05/2016 1540   PROT 7.2 09/05/2016 1540   ALBUMIN 3.9  09/05/2016 1540   AST 18 09/05/2016 1540   ALT 16 09/05/2016 1540   ALKPHOS 60 09/05/2016 1540   BILITOT <0.22 09/05/2016 1540   GFRNONAA >60 09/29/2016 1545   GFRNONAA 85 06/10/2015 0916   GFRAA >60 09/29/2016 1545   GFRAA >89 06/10/2015 0916    INo results found for: SPEP, UPEP  Lab Results  Component Value Date   WBC 5.9 09/29/2016   NEUTROABS 4.6 09/05/2016   HGB 12.5 09/29/2016   HCT 37.5 09/29/2016   MCV 95.4 09/29/2016   PLT 314 09/29/2016      Chemistry      Component Value Date/Time   NA 141 09/29/2016 1545   NA 140 09/05/2016 1540   K 3.5 09/29/2016 1545   K 3.8 09/05/2016 1540   CL 109 09/29/2016 1545   CO2 24 09/29/2016 1545   CO2 26 09/05/2016 1540   BUN 8 09/29/2016 1545   BUN 11.3 09/05/2016 1540   CREATININE 0.68 09/29/2016 1545   CREATININE 1.1 09/05/2016 1540  Component Value Date/Time   CALCIUM 9.3 09/29/2016 1545   CALCIUM 9.4 09/05/2016 1540   ALKPHOS 60 09/05/2016 1540   AST 18 09/05/2016 1540   ALT 16 09/05/2016 1540   BILITOT <0.22 09/05/2016 1540       No results found for: LABCA2  No components found for: LABCA125  No results for input(s): INR in the last 168 hours.  Urinalysis    Component Value Date/Time   BILIRUBINUR neg 06/15/2016 1029   PROTEINUR neg 06/15/2016 1029   UROBILINOGEN negative 06/15/2016 1029   NITRITE neg 06/15/2016 1029   LEUKOCYTESUR Negative 06/15/2016 1029     STUDIES:  Review of her November 2017 mammogram shows the breast density to be category B.  ELIGIBLE FOR AVAILABLE RESEARCH PROTOCOL: decided against COMET trial  ASSESSMENT: 61 y.o. [place] woman status post left breast biopsy 08/17/2016 for ductal carcinoma in situ, grade 2, estrogen and progesterone receptor positive.    (1)  Status post left lumpectomy 09/29/2016 for ductal carcinoma in situ, grade 2, measuring 1.1 cm, with ample margins   (2) adjuvant radiation 10/23/16 - 12/01/16 Site/dose:   Left breast treated  to 50 Gy in 25 fx of 2 Gy                      Left breast boosted to 60 Gy in 5 fx of 2 Gy  (3)  Anastrozole started 01/08/2017--discontinued after a month with multiple side effects  (a) to start exemestane 04/10/2017  (4) tobacco abuse: Quit smoking 03/27/2017.  PLAN: Ellisa did not do so well with anastrozole. Today we discussed switching to tamoxifen but she has "read up on it" and does not want to hazard the possibility of uterine cancer or other side effects.  Accordingly we are going to give exemestane and try. She understands it may have the same side effects as anastrozole but on the other hand it may be better tolerated.  I did alert her regarding questions of cost and she will let me know if they ask more than $10 or so per month for this drug  Otherwise she has a very busy schedule. She will see me again mid October. If she tolerates exemestane well the plan will be to continue for a total of 5 years.  They really great news is that she quit smoking. Hopefully that will be a permanent change.    [name], MD   04/10/2017 9:43 PM Medical Oncology and Hematology [facility] [address] [place], [place] [address] Tel. [phone]    Fax. [phone]

## 2017-04-10 ENCOUNTER — Ambulatory Visit (HOSPITAL_BASED_OUTPATIENT_CLINIC_OR_DEPARTMENT_OTHER): Payer: 59 | Admitting: Oncology

## 2017-04-10 VITALS — BP 131/75 | HR 65 | Temp 98.7°F | Resp 18 | Ht 70.0 in | Wt 179.8 lb

## 2017-04-10 DIAGNOSIS — Z170 Estrogen receptor positive status [ER+]: Secondary | ICD-10-CM

## 2017-04-10 DIAGNOSIS — D0512 Intraductal carcinoma in situ of left breast: Secondary | ICD-10-CM | POA: Diagnosis not present

## 2017-04-10 MED ORDER — EXEMESTANE 25 MG PO TABS: 25.0000 mg | ORAL_TABLET | Freq: Every day | ORAL | 4 refills | Status: DC

## 2017-06-27 ENCOUNTER — Ambulatory Visit: Payer: 59 | Admitting: Oncology

## 2017-06-27 ENCOUNTER — Other Ambulatory Visit: Payer: 59

## 2017-07-24 ENCOUNTER — Other Ambulatory Visit: Payer: Self-pay | Admitting: *Deleted

## 2017-07-24 DIAGNOSIS — D0512 Intraductal carcinoma in situ of left breast: Secondary | ICD-10-CM

## 2017-07-24 NOTE — Progress Notes (Signed)
[place]  Telephone:(336) [phone] Fax:(336) [phone]     ID: Jamie Gilbert DOB: [birth year]  MR#: [id number]  JQB#:[id number]  Patient Care Team: Elby Showers, MD as PCP - General (Internal Medicine) Magrinat, Virgie Dad, MD as Consulting Physician (Oncology) [name], MD as Consulting Physician (General Surgery) [name], MD as Consulting Physician (Radiation Oncology) OTHER MD:  CHIEF COMPLAINT: Ductal carcinoma in situ  CURRENT TREATMENT: Observation   BREAST CANCER HISTORY:  from the original intake note:  The patient had bilateral screening mammography with tomography at the [facility] 06/08/2016. There were calcifications in the left breast which were further evaluated 08/14/2016 with left unilateral diagnostic mammography. The breast density was category B. There was a developing group of heterogeneous calcifications in the left breast lateral to the nipple at the 2:00 position spanning 2 cm. 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  The patient's subsequent history is as detailed below  INTERVAL HISTORY: Jamie Gilbert returns today for follow-up and treatment of her ductal carcinoma in situ.  We had started her on anastrozole but she did poorly with that so we switched her to exemestane at her last visit.  She took the exemestane for approximately 3 weeks and actually tolerated it quite well.  She only paid for dollars for it.  However when she ran out of the medication she restarted.  She is at this point not motivated in trying any further antiestrogens  Unfortunately she resumed smoking about 3 weeks ago  REVIEW OF SYSTEMS: Jamie Gilbert is exercising regularly.  She has a good diet.  A detailed review of systems today was otherwise noncontributory  PAST MEDICAL HISTORY: Past Medical  History:  Diagnosis Date   Breast cancer ([facility] [place])    DCIS   Hyperlipidemia    PONV (postoperative nausea and vomiting)    Vitamin D deficiency     PAST SURGICAL HISTORY: Past Surgical History:  Procedure Laterality Date   ECTOPIC PREGNANCY SURGERY     medial meniscus repair     left knee w/plica excised   TUBAL LIGATION      FAMILY HISTORY Family History  Problem Relation Age of Onset   Hypertension Mother    Aortic aneurysm Mother    Brain cancer Father    Prostate cancer Brother 38  The patient's father died from a brain tumor at age 60. The patient's mother died from a ruptured aortic aneurysm at age 68. The patient has 3 brothers, no sisters. There is no history of breast or ovarian cancer in the family to her knowledge  GYNECOLOGIC HISTORY:  No LMP recorded. Patient is postmenopausal. Menarche age 29, first live birth age 68. She is GX P2. She went through the change of life in 2008. She did not use hormone replacement. She never used oral contraceptives.  SOCIAL HISTORY:  [place] has mostly been a homemaker. Her husband Delfino Lovett is an Chief Financial Officer. He does a lot of traveling. Daughter Jamie Gilbert lives in [place] currently unemployed but had been working in Scientist, research (medical). Daughter Jamie Gilbert lives in [place] where she is a Agricultural engineer. The patient has no grandchildren. She is not a church attender    ADVANCED DIRECTIVES: The patient has a living will in place. Her husband is her healthcare power of attorney   HEALTH MAINTENANCE: Social History   Tobacco Use   Smoking status: Current Some Day Smoker  Packs/day: 1.50    Years: 25.00    Pack years: 37.50    Types: Cigarettes, E-cigarettes   Smokeless tobacco: Current User  Substance Use Topics   Alcohol use: Yes    Alcohol/week: 12.6 oz    Types: 14 Standard drinks or equivalent, 7 Glasses of wine per week   Drug use: No     Colonoscopy: More than 10 years ago  PAP:  Bone density:  2012(?)--Osteopenia per patient report   Allergies  Allergen Reactions   Eggs Or Egg-Derived Products Nausea And Vomiting    Current Outpatient Medications  Medication Sig Dispense Refill   ergocalciferol (VITAMIN D2) 50000 units capsule Take 1 capsule (50,000 Units total) by mouth once a week. 4 capsule 2   exemestane (AROMASIN) 25 MG tablet Take 1 tablet (25 mg total) by mouth daily after breakfast. 90 tablet 4   rosuvastatin (CRESTOR) 5 MG tablet Take 1 tablet (5 mg total) by mouth daily. 90 tablet 1   No current facility-administered medications for this visit.     OBJECTIVE: Middle-aged white woman who appears well  Vitals:   07/25/17 0934  BP: 119/73  Pulse: 79  Resp: 18  Temp: 98.7 F (37.1 C)  SpO2: 100%     Body mass index is 25.7 kg/m.    ECOG FS:0 - Asymptomatic  Sclerae unicteric, pupils round and equal Oropharynx clear and moist No cervical or supraclavicular adenopathy Lungs no rales or rhonchi Heart regular rate and rhythm Abd soft, nontender, positive bowel sounds MSK no focal spinal tenderness, no upper extremity lymphedema Neuro: nonfocal, well oriented, appropriate affect Breasts: The right breast is benign.  The left breast is status post lumpectomy followed by radiation with no evidence of local recurrence.  Both axillae are benign. Skin: There is a lesion in the abdomen which I have photographed and will require dermatologic follow-up  Abdominal lesion noted 07-25-2017    LAB RESULTS:  CMP     Component Value Date/Time   NA 141 09/29/2016 1545   NA 140 09/05/2016 1540   K 3.5 09/29/2016 1545   K 3.8 09/05/2016 1540   CL 109 09/29/2016 1545   CO2 24 09/29/2016 1545   CO2 26 09/05/2016 1540   GLUCOSE 84 09/29/2016 1545   GLUCOSE 102 09/05/2016 1540   BUN 8 09/29/2016 1545   BUN 11.3 09/05/2016 1540   CREATININE 0.68 09/29/2016 1545   CREATININE 1.1 09/05/2016 1540   CALCIUM 9.3 09/29/2016 1545   CALCIUM 9.4 09/05/2016 1540   PROT  7.2 09/05/2016 1540   ALBUMIN 3.9 09/05/2016 1540   AST 18 09/05/2016 1540   ALT 16 09/05/2016 1540   ALKPHOS 60 09/05/2016 1540   BILITOT <0.22 09/05/2016 1540   GFRNONAA >60 09/29/2016 1545   GFRNONAA 85 06/10/2015 0916   GFRAA >60 09/29/2016 1545   GFRAA >89 06/10/2015 0916    INo results found for: SPEP, UPEP  Lab Results  Component Value Date   WBC 4.7 07/25/2017   NEUTROABS 3.0 07/25/2017   HGB 13.8 07/25/2017   HCT 41.7 07/25/2017   MCV 98.3 07/25/2017   PLT 299 07/25/2017      Chemistry      Component Value Date/Time   NA 141 09/29/2016 1545   NA 140 09/05/2016 1540   K 3.5 09/29/2016 1545   K 3.8 09/05/2016 1540   CL 109 09/29/2016 1545   CO2 24 09/29/2016 1545   CO2 26 09/05/2016 1540   BUN 8 09/29/2016 1545  BUN 11.3 09/05/2016 1540   CREATININE 0.68 09/29/2016 1545   CREATININE 1.1 09/05/2016 1540      Component Value Date/Time   CALCIUM 9.3 09/29/2016 1545   CALCIUM 9.4 09/05/2016 1540   ALKPHOS 60 09/05/2016 1540   AST 18 09/05/2016 1540   ALT 16 09/05/2016 1540   BILITOT <0.22 09/05/2016 1540       No results found for: LABCA2  No components found for: LABCA125  No results for input(s): INR in the last 168 hours.  Urinalysis    Component Value Date/Time   BILIRUBINUR neg 06/15/2016 1029   PROTEINUR neg 06/15/2016 1029   UROBILINOGEN negative 06/15/2016 1029   NITRITE neg 06/15/2016 1029   LEUKOCYTESUR Negative 06/15/2016 1029     STUDIES: No results found.   ELIGIBLE FOR AVAILABLE RESEARCH PROTOCOL: decided against COMET trial  ASSESSMENT: 61 y.o. [place] woman status post left breast biopsy 08/17/2016 for ductal carcinoma in situ, grade 2, estrogen and progesterone receptor positive.    (1)  Status post left lumpectomy 09/29/2016 for ductal carcinoma in situ, grade 2, measuring 1.1 cm, with ample margins   (2) adjuvant radiation 10/23/16 - 12/01/16 Site/dose:   Left breast treated to 50 Gy in 25 fx of 2  Gy                      Left breast boosted to 60 Gy in 5 fx of 2 Gy  (3)  Anastrozole started 01/08/2017--discontinued after a month with multiple side effects  (a) to start exemestane 04/10/2017, discontinued after 3 weeks per patient  (4) tobacco abuse: Quit smoking 03/27/2017, resumed October 2018  PLAN: Aideen will soon be 2 years out from definitive surgery for her noninvasive breast cancer, with no evidence of disease recurrence.  This is very favorable.  She is behind on her mammography.  I have gone ahead and set her up for mammograms next week.  She has a lesion on her abdomen which is worrisome to me.  I am referring her to dermatology, specifically Dr. Denna Haggard, for further evaluation.  Otherwise she will see me again in early December of next year, after her repeat 2019 mammogram.  I strongly encouraged her to discontinue smoking  She knows to call for any other issues that may develop before the next visit  Magrinat, Virgie Dad, MD  07/25/17 9:59 AM Medical Oncology and Hematology [facility] [place], [place] [address] Tel. [phone]    Fax. [phone]  This document serves as a record of services personally performed by Lurline Del, MD. It was created on his behalf by Sheron Nightingale, a trained medical scribe. The creation of this record is based on the scribe's personal observations and the provider's statements to them.   I have reviewed the above documentation for accuracy and completeness, and I agree with the above.

## 2017-07-25 ENCOUNTER — Telehealth: Payer: Self-pay | Admitting: Oncology

## 2017-07-25 ENCOUNTER — Other Ambulatory Visit (HOSPITAL_BASED_OUTPATIENT_CLINIC_OR_DEPARTMENT_OTHER): Payer: 59

## 2017-07-25 ENCOUNTER — Encounter: Payer: Self-pay | Admitting: Oncology

## 2017-07-25 ENCOUNTER — Ambulatory Visit (HOSPITAL_BASED_OUTPATIENT_CLINIC_OR_DEPARTMENT_OTHER): Payer: 59 | Admitting: Oncology

## 2017-07-25 VITALS — BP 119/73 | HR 79 | Temp 98.7°F | Resp 18 | Ht 70.0 in | Wt 179.1 lb

## 2017-07-25 DIAGNOSIS — Z170 Estrogen receptor positive status [ER+]: Secondary | ICD-10-CM

## 2017-07-25 DIAGNOSIS — D0512 Intraductal carcinoma in situ of left breast: Secondary | ICD-10-CM | POA: Diagnosis not present

## 2017-07-25 DIAGNOSIS — Z720 Tobacco use: Secondary | ICD-10-CM | POA: Diagnosis not present

## 2017-07-25 DIAGNOSIS — L989 Disorder of the skin and subcutaneous tissue, unspecified: Secondary | ICD-10-CM | POA: Diagnosis not present

## 2017-07-25 LAB — COMPREHENSIVE METABOLIC PANEL
ALT: 14 U/L (ref 0–55)
AST: 17 U/L (ref 5–34)
Albumin: 4 g/dL (ref 3.5–5.0)
Alkaline Phosphatase: 56 U/L (ref 40–150)
Anion Gap: 9 mEq/L (ref 3–11)
BUN: 10.3 mg/dL (ref 7.0–26.0)
CHLORIDE: 104 meq/L (ref 98–109)
CO2: 27 meq/L (ref 22–29)
CREATININE: 0.8 mg/dL (ref 0.6–1.1)
Calcium: 9.3 mg/dL (ref 8.4–10.4)
EGFR: >60 mL/min/{1.73_m2} (ref >60)
Glucose: 126 mg/dl (ref 70–140)
Potassium: 3.7 mEq/L (ref 3.5–5.1)
Sodium: 140 mEq/L (ref 136–145)
Total Bilirubin: 0.46 mg/dL (ref 0.20–1.20)
Total Protein: 7.3 g/dL (ref 6.4–8.3)

## 2017-07-25 LAB — CBC WITH DIFFERENTIAL/PLATELET
BASO%: 0.2 % (ref 0.0–2.0)
Basophils Absolute: 0 10*3/uL (ref 0.0–0.1)
EOS%: 0.9 % (ref 0.0–7.0)
Eosinophils Absolute: 0 10*3/uL (ref 0.0–0.5)
HCT: 41.7 % (ref 34.8–46.6)
HGB: 13.8 g/dL (ref 11.6–15.9)
LYMPH%: 26.5 % (ref 14.0–49.7)
MCH: 32.5 pg (ref 25.1–34.0)
MCHC: 33.1 g/dL (ref 31.5–36.0)
MCV: 98.3 fL (ref 79.5–101.0)
MONO#: 0.4 10*3/uL (ref 0.1–0.9)
MONO%: 9 % (ref 0.0–14.0)
NEUT#: 3 10*3/uL (ref 1.5–6.5)
NEUT%: 63.4 % (ref 38.4–76.8)
Platelets: 299 10*3/uL (ref 145–400)
RBC: 4.24 10*6/uL (ref 3.70–5.45)
RDW: 13.4 % (ref 11.2–14.5)
WBC: 4.7 10*3/uL (ref 3.9–10.3)
lymph#: 1.2 10*3/uL (ref 0.9–3.3)

## 2017-07-25 NOTE — Telephone Encounter (Signed)
Gave patient avs report and appointments for December 2019. Patient also given mammo for tomorrow at [facility] @ 9 am and with Dr. Denna Haggard for 09/25/2017 @ 10:30 am (1st available new patient appointment).

## 2017-07-26 ENCOUNTER — Ambulatory Visit
Admission: RE | Admit: 2017-07-26 | Discharge: 2017-07-26 | Disposition: A | Discharge status: Home / Self Care | Payer: 59 | Source: Ambulatory Visit | Attending: Oncology | Admitting: Oncology

## 2017-07-26 DIAGNOSIS — D0512 Intraductal carcinoma in situ of left breast: Secondary | ICD-10-CM

## 2017-09-25 ENCOUNTER — Other Ambulatory Visit: Payer: Self-pay | Admitting: Dermatology

## 2017-10-25 ENCOUNTER — Other Ambulatory Visit: Payer: Self-pay | Admitting: Dermatology

## 2018-06-17 ENCOUNTER — Other Ambulatory Visit: Payer: Self-pay | Admitting: Oncology

## 2018-06-17 DIAGNOSIS — Z9889 Other specified postprocedural states: Secondary | ICD-10-CM

## 2018-07-30 ENCOUNTER — Other Ambulatory Visit: Payer: Self-pay | Admitting: Oncology

## 2018-07-30 ENCOUNTER — Ambulatory Visit
Admission: RE | Admit: 2018-07-30 | Discharge: 2018-07-30 | Disposition: A | Discharge status: Home / Self Care | Payer: 59 | Source: Ambulatory Visit | Attending: Oncology | Admitting: Oncology

## 2018-07-30 DIAGNOSIS — R921 Mammographic calcification found on diagnostic imaging of breast: Secondary | ICD-10-CM

## 2018-07-30 DIAGNOSIS — Z9889 Other specified postprocedural states: Secondary | ICD-10-CM

## 2018-07-30 HISTORY — DX: Personal history of irradiation: Z92.3

## 2018-08-19 ENCOUNTER — Telehealth: Payer: Self-pay | Admitting: Internal Medicine

## 2018-08-19 MED ORDER — IBUPROFEN 800 MG PO TABS: 800.0000 mg | ORAL_TABLET | Freq: Three times a day (TID) | ORAL | 0 refills | Status: DC | PRN

## 2018-08-19 NOTE — Telephone Encounter (Signed)
No CPE since 2017 please book and then call in Ibuprofen

## 2018-08-19 NOTE — Telephone Encounter (Signed)
Patient calling to request a refill on Ibuprofen 800 mg.  She hasn't had them refilled since 2017.  She comes in to see you in 2 weeks.  She only has about 3 left.    Pharmacy:   [facility].    Phone #:  [phone]

## 2018-08-19 NOTE — Telephone Encounter (Signed)
Has CPE scheduled in this month. Ibuprofen was refilled.

## 2018-08-25 NOTE — Progress Notes (Signed)
[place]  [phone] [phone]     ID: Jamie Gilbert DOB: November 13, 1955  MR#: [id number]  IOX#:[id number]  Patient Care Team: Elby Showers, MD as PCP - General (Internal Medicine) Matisse Salais, Virgie Dad, MD as Consulting Physician (Oncology) [name], MD as Consulting Physician (General Surgery) [name], MD as Consulting Physician (Radiation Oncology) [name], MD OTHER MD:  CHIEF COMPLAINT: Ductal carcinoma in situ  CURRENT TREATMENT: Observation-   BREAST CANCER HISTORY:  from the original intake note:  The patient had bilateral screening mammography with tomography at the [facility] 06/08/2016. There were calcifications in the left breast which were further evaluated 08/14/2016 with left unilateral diagnostic mammography. The breast density was category B. There was a developing group of heterogeneous calcifications in the left breast lateral to the nipple at the 2:00 position spanning 2 cm. 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  The patient's subsequent history is as detailed below  INTERVAL HISTORY: Jamie Gilbert returns today for follow-up of her ductal carcinoma in situ.   The patient is no longer on Aromasin/exemestane. She stopped taking this about 4 or 5 months ago. She saw that her moods were rollercoaster-like on the medicine. This has gotten better since discontinuance.  She now feels "more like herself".  Since her last visit here, she underwent a digital diagnostic bilateral mammogram with tomography on 07/30/2018, showing: Breast Density Category C. There are postlumpectomy changes left breast. There a few new calcifications at the lumpectomy site which are favored to represent early fat necrosis. There are no additional concerning masses,  calcifications or distortion identified within either breast. She has repeat mammography scheduled for 01/27/2019.    REVIEW OF SYSTEMS: Jamie Gilbert is doing well overall. Has been cleaning out her basement for a renovation, which has been aggravating her sinuses. She has had to put her dog through multiple surgeries due to a brown recluse spider bite. The patient denies unusual headaches, visual changes, nausea, vomiting, or dizziness. There has been no unusual cough, phlegm production, or pleurisy. This been no change in bowel or bladder habits. The patient denies unexplained fatigue or unexplained weight loss, bleeding, rash, or fever. A detailed review of systems was otherwise noncontributory.    PAST MEDICAL HISTORY: Past Medical History:  Diagnosis Date  . 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 Hyperlipidemia   . Personal history of radiation therapy   . PONV (postoperative nausea and vomiting)   . Vitamin D deficiency     PAST SURGICAL HISTORY: Past Surgical History:  Procedure Laterality Date  . BREAST BIOPSY    . BREAST LUMPECTOMY Left    2017  . BREAST LUMPECTOMY WITH RADIOACTIVE SEED LOCALIZATION Left 09/29/2016   Procedure: LEFT BREAST LUMPECTOMY WITH RADIOACTIVE SEED LOCALIZATION;  Surgeon: [name], MD;  Location: [facility];  Service: General;  Laterality: Left;  . ECTOPIC PREGNANCY SURGERY    . medial meniscus repair     left knee w/plica excised  . TUBAL LIGATION      FAMILY HISTORY Family History  Problem Relation Age of Onset  . Hypertension Mother   . Aortic aneurysm Mother   . Brain cancer Father   . Prostate cancer Brother 51  The patient's father died from a brain tumor at age 76. The patient's mother died from a ruptured aortic aneurysm at age 28. The patient has  3 brothers, no sisters. There is no history of breast or ovarian cancer in the family to her knowledge  GYNECOLOGIC HISTORY:  No LMP recorded. Patient is postmenopausal. Menarche age 51, first live birth  age 82. She is GX P2. She went through the change of life in 2008. She did not use hormone replacement. She never used oral contraceptives.  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 Her husband Delfino Lovett is an Chief Financial Officer. He does a lot of traveling. Daughter Gloriajean Dell lives in [place] currently unemployed but had been working in Scientist, research (medical). Daughter Dorann Lodge lives in [place] where she is a Agricultural engineer. The patient has no grandchildren. She is not a church attender    ADVANCED DIRECTIVES: The patient has a living will in place. Her husband is her healthcare power of attorney   HEALTH MAINTENANCE: Social History   Tobacco Use  . Smoking status: Current Some Day Smoker    Packs/day: 1.50    Years: 25.00    Pack years: 37.50    Types: Cigarettes, E-cigarettes  . Smokeless tobacco: Current User  Substance Use Topics  . Alcohol use: Yes    Alcohol/week: 21.0 standard drinks    Types: 14 Standard drinks or equivalent, 7 Glasses of wine per week  . Drug use: No     Colonoscopy: More than 10 years ago  PAP:  Bone density: 2012(?)--Osteopenia per patient report   Allergies  Allergen Reactions  . Eggs Or Egg-Derived Products Nausea And Vomiting    Current Outpatient Medications  Medication Sig Dispense Refill  . ergocalciferol (VITAMIN D2) 50000 units capsule Take 1 capsule (50,000 Units total) by mouth once a week. 4 capsule 2  . ibuprofen (ADVIL,MOTRIN) 800 MG tablet Take 1 tablet (800 mg total) by mouth every 8 (eight) hours as needed. 30 tablet 0  . rosuvastatin (CRESTOR) 5 MG tablet Take 1 tablet (5 mg total) by mouth daily. 90 tablet 1   No current facility-administered medications for this visit.     OBJECTIVE: Middle-aged white woman who appears stated age  62:   08/26/18 1304  BP: 140/85  Pulse: 70  Resp: 18  Temp: 98.8 F (37.1 C)  SpO2: 99%     Body mass index is 25.3 kg/m.    ECOG FS:0 - Asymptomatic  Sclerae unicteric, pupils  round and equal Oropharynx clear and moist No cervical or supraclavicular adenopathy Lungs no rales or rhonchi Heart regular rate and rhythm Abd soft, nontender, positive bowel sounds MSK no focal spinal tenderness, no upper extremity lymphedema Neuro: nonfocal, well oriented, appropriate affect Breasts: The right breast is benign.  The left breast is status post lumpectomy and radiation.  There is no evidence of local recurrence.  Both axillae are benign.  LAB RESULTS:  CMP     Component Value Date/Time   NA 140 07/25/2017 0917   K 3.7 07/25/2017 0917   CL 109 09/29/2016 1545   CO2 27 07/25/2017 0917   GLUCOSE 126 07/25/2017 0917   BUN 10.3 07/25/2017 0917   CREATININE 0.8 07/25/2017 0917   CALCIUM 9.3 07/25/2017 0917   PROT 7.3 07/25/2017 0917   ALBUMIN 4.0 07/25/2017 0917   AST 17 07/25/2017 0917   ALT 14 07/25/2017 0917   ALKPHOS 56 07/25/2017 0917   BILITOT 0.46 07/25/2017 0917   GFRNONAA >60 09/29/2016 1545   GFRNONAA 85 06/10/2015 0916   GFRAA >60 09/29/2016 1545   GFRAA >89 06/10/2015 0916    INo results found  for: SPEP, UPEP  Lab Results  Component Value Date   WBC 4.7 07/25/2017   NEUTROABS 3.0 07/25/2017   HGB 13.8 07/25/2017   HCT 41.7 07/25/2017   MCV 98.3 07/25/2017   PLT 299 07/25/2017      Chemistry      Component Value Date/Time   NA 140 07/25/2017 0917   K 3.7 07/25/2017 0917   CL 109 09/29/2016 1545   CO2 27 07/25/2017 0917   BUN 10.3 07/25/2017 0917   CREATININE 0.8 07/25/2017 0917      Component Value Date/Time   CALCIUM 9.3 07/25/2017 0917   ALKPHOS 56 07/25/2017 0917   AST 17 07/25/2017 0917   ALT 14 07/25/2017 0917   BILITOT 0.46 07/25/2017 0917       No results found for: LABCA2  No components found for: LABCA125  No results for input(s): INR in the last 168 hours.  Urinalysis    Component Value Date/Time   BILIRUBINUR neg 06/15/2016 1029   PROTEINUR neg 06/15/2016 1029   UROBILINOGEN negative 06/15/2016 1029    NITRITE neg 06/15/2016 1029   LEUKOCYTESUR Negative 06/15/2016 1029     STUDIES: Mm Diag Breast Tomo Bilateral  Result Date: 07/30/2018 CLINICAL DATA:  Patient with history of left breast lumpectomy 2018. EXAM: DIGITAL DIAGNOSTIC BILATERAL MAMMOGRAM WITH CAD AND TOMO COMPARISON:  Previous exam(s). ACR Breast Density Category c: The breast tissue is heterogeneously dense, which may obscure small masses. FINDINGS: Postlumpectomy changes left breast. There a few new calcifications at the lumpectomy site which are favored to represent early fat necrosis. No additional concerning masses, calcifications or distortion identified within either breast. Mammographic images were processed with CAD. IMPRESSION: New calcifications at the lumpectomy site favored to represent early fat necrosis. No mammographic evidence malignancy. RECOMMENDATION: Left breast diagnostic mammography with magnification views in 6 months to assess for interval evolution or stability of suspected fat necrosis calcifications at the lumpectomy site. I have discussed the findings and recommendations with the patient. Results were also provided in writing at the conclusion of the visit. If applicable, a reminder letter will be sent to the patient regarding the next appointment. BI-RADS CATEGORY  3: Probably benign. Electronically Signed   By: Lovey Newcomer M.D.   On: 07/30/2018 10:35     ELIGIBLE FOR AVAILABLE RESEARCH PROTOCOL: decided against COMET trial  ASSESSMENT: 62 y.o. [place] woman status post left breast biopsy 08/17/2016 for ductal carcinoma in situ, grade 2, estrogen and progesterone receptor positive.    (1)  Status post left lumpectomy 09/29/2016 for ductal carcinoma in situ, grade 2, measuring 1.1 cm, with ample margins   (2) adjuvant radiation 10/23/16 - 12/01/16 Site/dose:   Left breast treated to 50 Gy in 25 fx of 2 Gy                      Left breast boosted to 60 Gy in 5 fx of 2 Gy  (3)   Anastrozole started 01/08/2017--discontinued after a month with multiple side effects  (a) started exemestane 04/10/2017, discontinued July 2019 due to symptoms  (4) tobacco abuse: Quit smoking 03/27/2017.   PLAN: Ruby is now just about 2 years out from definitive surgery for breast cancer with no evidence of disease recurrence.  This is very favorable.  She is 0 for 2 of 2 antiestrogens.  At this point she is not motivated to try different 1.  Since her cancer was noninvasive the antiestrogens are more less optional,  more preventative than treatment, and I am comfortable with her decision.  She will see me again in 1 year.  He knows to call for any other issues that may develop before the next visit.  Jacory Kamel, Virgie Dad, MD  08/26/18 1:23 PM Medical Oncology and Hematology [facility] [address] [place], [place] [address] Tel. [phone]    Fax. [phone]   I, Jacqualyn Posey am acting as a Education administrator for [name], MD.   I, Lurline Del MD, have reviewed the above documentation for accuracy and completeness, and I agree with the above.

## 2018-08-26 ENCOUNTER — Telehealth: Payer: Self-pay | Admitting: Oncology

## 2018-08-26 ENCOUNTER — Inpatient Hospital Stay: Payer: 59 | Attending: Oncology | Admitting: Oncology

## 2018-08-26 VITALS — BP 140/85 | HR 70 | Temp 98.8°F | Resp 18 | Ht 70.0 in | Wt 176.3 lb

## 2018-08-26 DIAGNOSIS — Z87891 Personal history of nicotine dependence: Secondary | ICD-10-CM | POA: Diagnosis not present

## 2018-08-26 DIAGNOSIS — Z86000 Personal history of in-situ neoplasm of breast: Secondary | ICD-10-CM | POA: Insufficient documentation

## 2018-08-26 DIAGNOSIS — Z923 Personal history of irradiation: Secondary | ICD-10-CM | POA: Diagnosis not present

## 2018-08-26 DIAGNOSIS — F17210 Nicotine dependence, cigarettes, uncomplicated: Secondary | ICD-10-CM

## 2018-08-26 DIAGNOSIS — D0512 Intraductal carcinoma in situ of left breast: Secondary | ICD-10-CM

## 2018-08-26 NOTE — Telephone Encounter (Signed)
Patient decline avs and calendar °

## 2018-08-30 ENCOUNTER — Telehealth: Payer: Self-pay

## 2018-08-30 DIAGNOSIS — Z0000 Encounter for general adult medical examination without abnormal findings: Secondary | ICD-10-CM

## 2018-08-30 DIAGNOSIS — E785 Hyperlipidemia, unspecified: Secondary | ICD-10-CM

## 2018-09-02 ENCOUNTER — Other Ambulatory Visit: Payer: 59 | Admitting: Internal Medicine

## 2018-09-02 ENCOUNTER — Other Ambulatory Visit: Payer: Self-pay | Admitting: Internal Medicine

## 2018-09-02 DIAGNOSIS — E785 Hyperlipidemia, unspecified: Secondary | ICD-10-CM

## 2018-09-02 DIAGNOSIS — Z0000 Encounter for general adult medical examination without abnormal findings: Secondary | ICD-10-CM

## 2018-09-04 ENCOUNTER — Encounter: Payer: Self-pay | Admitting: Internal Medicine

## 2018-09-04 ENCOUNTER — Ambulatory Visit (INDEPENDENT_AMBULATORY_CARE_PROVIDER_SITE_OTHER): Payer: 59 | Admitting: Internal Medicine

## 2018-09-04 VITALS — BP 110/80 | HR 74 | Ht 70.0 in | Wt 176.0 lb

## 2018-09-04 DIAGNOSIS — E559 Vitamin D deficiency, unspecified: Secondary | ICD-10-CM

## 2018-09-04 DIAGNOSIS — Z87891 Personal history of nicotine dependence: Secondary | ICD-10-CM

## 2018-09-04 DIAGNOSIS — Z86000 Personal history of in-situ neoplasm of breast: Secondary | ICD-10-CM

## 2018-09-04 DIAGNOSIS — Z8249 Family history of ischemic heart disease and other diseases of the circulatory system: Secondary | ICD-10-CM

## 2018-09-04 DIAGNOSIS — M8580 Other specified disorders of bone density and structure, unspecified site: Secondary | ICD-10-CM | POA: Diagnosis not present

## 2018-09-04 DIAGNOSIS — Z0000 Encounter for general adult medical examination without abnormal findings: Secondary | ICD-10-CM | POA: Diagnosis not present

## 2018-09-04 DIAGNOSIS — E7800 Pure hypercholesterolemia, unspecified: Secondary | ICD-10-CM | POA: Diagnosis not present

## 2018-09-04 LAB — POCT URINALYSIS DIPSTICK
Appearance: NEGATIVE
Bilirubin, UA: NEGATIVE
Blood, UA: NEGATIVE
GLUCOSE UA: NEGATIVE
Ketones, UA: NEGATIVE
LEUKOCYTES UA: NEGATIVE
Nitrite, UA: NEGATIVE
Odor: NEGATIVE
PROTEIN UA: NEGATIVE
SPEC GRAV UA: 1.01 (ref 1.010–1.025)
Urobilinogen, UA: 0.2 E.U./dL
pH, UA: 6 (ref 5.0–8.0)

## 2018-09-04 MED ORDER — ERGOCALCIFEROL 1.25 MG (50000 UT) PO CAPS: 50000.0000 [IU] | ORAL_CAPSULE | ORAL | 0 refills | Status: DC

## 2018-09-04 MED ORDER — ROSUVASTATIN CALCIUM 5 MG PO TABS: 5.0000 mg | ORAL_TABLET | Freq: Every day | ORAL | 3 refills | Status: DC

## 2018-09-04 NOTE — Progress Notes (Signed)
Subjective:    Patient ID: Jamie Gilbert, female    DOB: Apr 14, 1956, 62 y.o.   MRN: [id number]  HPI 62 year old Female in today for health maintenance exam and evaluation of medical issues.  She has a history of breast cancer and is followed by Dr. Jana Hakim.  Generally seen here once yearly.  Had left breast ductal carcinoma in situ Fall 2017.  History of vitamin D deficiency and hyperlipidemia.  In 2017 was placed on Crestor 3 times a week.  Total cholesterol was 277 and LDL cholesterol was 193 with normal triglycerides and an HDL of 69.Ran out of it. Have reordered it. Needs follow up in 3 months. Lipids are elevated off Crestor.  Continues to smoke but not willing to quit.  Declines flu vaccine.  Social history: Married.  Social alcohol consumption.  2 adult daughters.  Travels with her husband.  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  This is her second marriage.  Family history: Mother died with abdominal aortic aneurysm around age 96.  She had a history of hypertension and was a former smoker.  Father died at age 83 with a brain tumor.  3 brothers in their 21s in good health with the exception of hyperlipidemia.  No sisters.  History of right middle lobe pneumonia January 2000.  Infectious colitis February 2000.  Ectopic pregnancy bilateral tubal ligation 1990.  Torn medial meniscus of left knee with plica being excised 4098 and subsequently developed regional pain syndrome post surgery.  History of abnormal mammogram 2011 with benign breast biopsy.  In 2011 she was started on Zocor but has not been compliant with diet.  History of vitamin D deficiency and has not really been compliant with daily vitamin D supplementation.  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  She had lumpectomy January 2018 and completed adjuvant radiation therapy.  Antiestrogen therapy was recommended.  She did poorly  with anastrozole so she was switched to exemestane but was not motivated to stay on it.  Needs screening colonoscopy.  Dr. Jana Hakim referred her to a dermatologist in 2018 for lesion on her abdomen.  I am not sure she followed up with that.  She is supposed to see Dr. Jana Hakim later this month     Review of Systems see above     Objective:   Physical Exam Constitutional:      General: She is not in acute distress.    Appearance: Normal appearance. She is normal weight. She is not ill-appearing.  HENT:     Head: Normocephalic and atraumatic.     Right Ear: Tympanic membrane, ear canal and external ear normal.     Left Ear: Tympanic membrane and ear canal normal.     Nose: No congestion or rhinorrhea.     Mouth/Throat:     Mouth: Mucous membranes are dry.     Pharynx: Oropharynx is clear.  Eyes:     General: No scleral icterus.       Right eye: No discharge.        Left eye: No discharge.  Neck:     Musculoskeletal: Neck supple.     Vascular: No carotid bruit.  Cardiovascular:     Rate and Rhythm: Normal rate. Rhythm irregular.     Pulses: Normal pulses.     Heart sounds: No murmur.  Pulmonary:     Effort: No respiratory distress.     Breath sounds: No  wheezing.  Abdominal:     General: There is no distension.     Palpations: There is no mass.     Tenderness: There is no abdominal tenderness. There is no guarding.     Hernia: No hernia is present.  Genitourinary:    Comments: Pap done 2017.  Bimanual normal. Lymphadenopathy:     Cervical: No cervical adenopathy.  Skin:    General: Skin is warm and dry.     Comments: Scattered nevi on abdomen one is dark and needs to be evaluated by dermatologist  Neurological:     General: No focal deficit present.     Mental Status: She is alert and oriented to person, place, and time.     Cranial Nerves: No cranial nerve deficit.     Sensory: No sensory deficit.     Motor: No weakness.     Coordination: Coordination normal.      Deep Tendon Reflexes: Reflexes normal.  Psychiatric:        Mood and Affect: Mood normal.        Thought Content: Thought content normal.        Judgment: Judgment normal.           Assessment & Plan:  History of left breast cancer-did not continue antiestrogen therapy  History of vitamin D deficiency-Drisdol 50,000 units weekly x 4 months with repeat level then  Needs colonoscopy.  Last one was more than 10 years ago- records faxed to [facility]  Dark nevus on abdomen needs to be checked by dermatologist  Hyperlipidemia-ask pt to restart lipid med     Plan:Restart Crestor; Take Vitamin D weekly. F/u 3-4 months.  Can make referral to [facility] for colonoscopy which is where she prefers to go.  Needs to be motivated to take better care of herself but that has been difficult over the years.  Needs to quit smoking.

## 2018-09-05 ENCOUNTER — Encounter: Payer: Managed Care, Other (non HMO) | Admitting: Internal Medicine

## 2018-09-06 LAB — CBC WITH DIFFERENTIAL/PLATELET
ABSOLUTE MONOCYTES: 359 {cells}/uL (ref 200–950)
BASOS PCT: 0.8 %
Basophils Absolute: 42 cells/uL (ref 0–200)
EOS ABS: 31 {cells}/uL (ref 15–500)
Eosinophils Relative: 0.6 %
HEMATOCRIT: 40.8 % (ref 35.0–45.0)
HEMOGLOBIN: 13.8 g/dL (ref 11.7–15.5)
LYMPHS ABS: 1383 {cells}/uL (ref 850–3900)
MCH: 32.1 pg (ref 27.0–33.0)
MCHC: 33.8 g/dL (ref 32.0–36.0)
MCV: 94.9 fL (ref 80.0–100.0)
MPV: 9.4 fL (ref 7.5–12.5)
Monocytes Relative: 6.9 %
NEUTROS ABS: 3385 {cells}/uL (ref 1500–7800)
Neutrophils Relative %: 65.1 %
Platelets: 380 10*3/uL (ref 140–400)
RBC: 4.3 10*6/uL (ref 3.80–5.10)
RDW: 12.3 % (ref 11.0–15.0)
Total Lymphocyte: 26.6 %
WBC: 5.2 10*3/uL (ref 3.8–10.8)

## 2018-09-06 LAB — COMPREHENSIVE METABOLIC PANEL
AG Ratio: 1.7 (calc) (ref 1.0–2.5)
ALBUMIN MSPROF: 4.4 g/dL (ref 3.6–5.1)
ALT: 11 U/L (ref 6–29)
AST: 14 U/L (ref 10–35)
Alkaline phosphatase (APISO): 68 U/L (ref 33–130)
BUN: 10 mg/dL (ref 7–25)
CO2: 26 mmol/L (ref 20–32)
CREATININE: 0.79 mg/dL (ref 0.50–0.99)
Calcium: 9.7 mg/dL (ref 8.6–10.4)
Chloride: 102 mmol/L (ref 98–110)
Globulin: 2.6 g/dL (calc) (ref 1.9–3.7)
Glucose, Bld: 96 mg/dL (ref 65–99)
POTASSIUM: 4.2 mmol/L (ref 3.5–5.3)
SODIUM: 139 mmol/L (ref 135–146)
Total Bilirubin: 0.5 mg/dL (ref 0.2–1.2)
Total Protein: 7 g/dL (ref 6.1–8.1)

## 2018-09-06 LAB — VITAMIN D 25 HYDROXY (VIT D DEFICIENCY, FRACTURES): Vit D, 25-Hydroxy: 9 ng/mL — ABNORMAL LOW (ref 30–100)

## 2018-09-06 LAB — LIPID PANEL
CHOL/HDL RATIO: 5.6 (calc) — AB (ref <5.0)
CHOLESTEROL: 303 mg/dL — AB (ref <200)
HDL: 54 mg/dL (ref >50)
LDL Cholesterol (Calc): 221 mg/dL (calc) — ABNORMAL HIGH
NON-HDL CHOLESTEROL (CALC): 249 mg/dL — AB (ref <130)
Triglycerides: 131 mg/dL (ref <150)

## 2018-09-06 LAB — TSH: TSH: 1.84 mIU/L (ref 0.40–4.50)

## 2018-09-14 NOTE — Patient Instructions (Addendum)
Advise restart Crestor and follow up in 3 months. Drisdol 50,000 units weekly and follow up in 3 months.  Needs annual mammogram.  Needs bone density study.  Declines flu vaccine.  Can make referral for colonoscopy.

## 2018-09-14 NOTE — Telephone Encounter (Signed)
Patient needs to book lab work in the near future.  Just restarted vitamin D and Crestor.  Ideally this should be in about 3 months. Says she was given appt.Rx  Given for Shingrix vaccine

## 2018-10-07 ENCOUNTER — Telehealth: Payer: Self-pay | Admitting: Internal Medicine

## 2018-10-07 MED ORDER — IBUPROFEN 800 MG PO TABS: 800.0000 mg | ORAL_TABLET | Freq: Three times a day (TID) | ORAL | 5 refills | Status: DC | PRN

## 2018-10-07 NOTE — Telephone Encounter (Signed)
Patient calling to request refill on Ibuprofen 800mg .    Pharmacy:  [facility]  Thank you.

## 2018-10-07 NOTE — Telephone Encounter (Signed)
Refill x 6 months. Tell her [facility] has been trying to schedule colonoscopy with her and can't get her to answer

## 2018-10-07 NOTE — Telephone Encounter (Signed)
Refilled

## 2018-11-21 ENCOUNTER — Telehealth: Payer: Self-pay | Admitting: Internal Medicine

## 2018-11-21 NOTE — Telephone Encounter (Signed)
Peak  [place] called to say that her adult daughter has tested positive for the flu and she had picked her up from airport on Saturday night and spent Sunday with her, and now Demiah is not feeling well today at all. Her daughters doctor suggested she call her PCP and let them know, so she could possibly get some Tamaflu called in.

## 2018-11-21 NOTE — Telephone Encounter (Signed)
Needs OV and flu test

## 2018-11-21 NOTE — Telephone Encounter (Signed)
Called and scheduled appointment for 11/22/18

## 2018-11-22 ENCOUNTER — Ambulatory Visit (INDEPENDENT_AMBULATORY_CARE_PROVIDER_SITE_OTHER): Payer: 59 | Admitting: Internal Medicine

## 2018-11-22 ENCOUNTER — Encounter: Payer: Self-pay | Admitting: Internal Medicine

## 2018-11-22 ENCOUNTER — Other Ambulatory Visit: Payer: Self-pay

## 2018-11-22 VITALS — BP 120/80 | HR 68 | Temp 98.0°F | Ht 70.0 in | Wt 176.0 lb

## 2018-11-22 DIAGNOSIS — Z87891 Personal history of nicotine dependence: Secondary | ICD-10-CM | POA: Diagnosis not present

## 2018-11-22 DIAGNOSIS — Z20828 Contact with and (suspected) exposure to other viral communicable diseases: Secondary | ICD-10-CM

## 2018-11-22 DIAGNOSIS — Z86000 Personal history of in-situ neoplasm of breast: Secondary | ICD-10-CM

## 2018-11-22 DIAGNOSIS — J22 Unspecified acute lower respiratory infection: Secondary | ICD-10-CM | POA: Diagnosis not present

## 2018-11-22 LAB — POCT INFLUENZA A/B
Influenza A, POC: NEGATIVE
Influenza B, POC: NEGATIVE

## 2018-11-22 MED ORDER — LEVOFLOXACIN 500 MG PO TABS: 500.0000 mg | ORAL_TABLET | Freq: Every day | ORAL | 0 refills | Status: DC

## 2018-11-22 MED ORDER — OSELTAMIVIR PHOSPHATE 75 MG PO CAPS: 75.0000 mg | ORAL_CAPSULE | Freq: Every day | ORAL | 0 refills | Status: DC

## 2018-11-22 NOTE — Patient Instructions (Signed)
Levaquin 500 mg daily for 10 days.  Tamiflu 75 mg daily for 5 days.  Rest and drink plenty of fluids.

## 2018-11-22 NOTE — Progress Notes (Signed)
   Subjective:    Patient ID: Jamie Gilbert, female    DOB: [birth year], 63 y.o.   MRN: [id number]  HPI She has been exposed to daughter who tested positive for the flu yesterday. Traveled to [place] around Valentine's Day.     Review of Systems no significant cough wheezing or shortness of breath.  History of smoking.  History of breast cancer.  Some aching of her legs     Objective:   Physical Exam Temp 98 pulse 68 RR 12 pulse oximetry 99% Skin warm and dry.  Nodes none.  Pharynx very slightly injected without exudate.  Rapid flu test is negative.  Neck is supple without adenopathy.  Chest is clear to auscultation without rales or wheezing.      Assessment & Plan:  Influenza exposure  Cough  Acute lower respiratory infection  History of breast cancer  History of smoking  Plan: Tamiflu 75 mg daily for 5 days.  Levaquin 500 mg daily for 10 days.  Rest and drink plenty of fluids.

## 2018-11-27 ENCOUNTER — Telehealth: Payer: Self-pay | Admitting: Internal Medicine

## 2018-11-27 NOTE — Telephone Encounter (Signed)
I would not give another round of Tamiflu. We don't know what is wrong with her husband.

## 2018-11-27 NOTE — Telephone Encounter (Signed)
LVM that Dr Renold Genta would not give anymore Tamaflu at this time

## 2018-11-27 NOTE — Telephone Encounter (Signed)
Jamie Gilbert [phone]  Mykell called to say that she is feeling much better and she completed her Tamaflu yesterday. She also stated that her husband is now sick, but has not been to his doctor yet, but she is wandering does she need to take another round of Tamaflu, or is she covered from most recent dose.

## 2018-12-02 ENCOUNTER — Other Ambulatory Visit: Payer: Self-pay

## 2018-12-02 ENCOUNTER — Ambulatory Visit
Admission: RE | Admit: 2018-12-02 | Discharge: 2018-12-02 | Disposition: A | Discharge status: Home / Self Care | Payer: 59 | Source: Ambulatory Visit | Attending: Internal Medicine | Admitting: Internal Medicine

## 2018-12-11 ENCOUNTER — Other Ambulatory Visit: Payer: Self-pay

## 2018-12-11 DIAGNOSIS — J019 Acute sinusitis, unspecified: Secondary | ICD-10-CM

## 2018-12-11 MED ORDER — LEVOFLOXACIN 500 MG PO TABS: 500.0000 mg | ORAL_TABLET | Freq: Every day | ORAL | 0 refills | Status: DC

## 2018-12-11 NOTE — Telephone Encounter (Signed)
Patient states she was here on 11/22/18 for a sinus infection and she finished the levaquin and was good for 3 weeks but now it feels like the sinus infection is coming back her ears and teeth are hurting and has nasal pressure. She would like a refill on her antibiotic.  Pharmacy [facility].

## 2018-12-11 NOTE — Telephone Encounter (Addendum)
Will refill antibiotic  Have sent refill on Levaquin via e-scribe

## 2019-01-27 ENCOUNTER — Other Ambulatory Visit: Payer: Self-pay | Admitting: Oncology

## 2019-01-27 ENCOUNTER — Other Ambulatory Visit: Payer: Self-pay

## 2019-01-27 ENCOUNTER — Ambulatory Visit
Admission: RE | Admit: 2019-01-27 | Discharge: 2019-01-27 | Disposition: A | Discharge status: Home / Self Care | Payer: 59 | Source: Ambulatory Visit | Attending: Oncology | Admitting: Oncology

## 2019-01-27 DIAGNOSIS — R921 Mammographic calcification found on diagnostic imaging of breast: Secondary | ICD-10-CM

## 2019-07-10 ENCOUNTER — Telehealth: Payer: Self-pay | Admitting: Internal Medicine

## 2019-07-10 ENCOUNTER — Ambulatory Visit (INDEPENDENT_AMBULATORY_CARE_PROVIDER_SITE_OTHER): Payer: 59 | Admitting: Internal Medicine

## 2019-07-10 ENCOUNTER — Other Ambulatory Visit: Payer: Self-pay

## 2019-07-10 ENCOUNTER — Encounter: Payer: Self-pay | Admitting: Internal Medicine

## 2019-07-10 VITALS — BP 140/100 | HR 80 | Temp 98.0°F | Ht 70.0 in | Wt 175.0 lb

## 2019-07-10 DIAGNOSIS — H1032 Unspecified acute conjunctivitis, left eye: Secondary | ICD-10-CM

## 2019-07-10 MED ORDER — OFLOXACIN 0.3 % OP SOLN: 2.0000 [drp] | Freq: Four times a day (QID) | OPHTHALMIC | 1 refills | Status: DC

## 2019-07-10 MED ORDER — ROSUVASTATIN CALCIUM 5 MG PO TABS: 5.0000 mg | ORAL_TABLET | Freq: Every day | ORAL | 3 refills | Status: DC

## 2019-07-10 NOTE — Telephone Encounter (Signed)
Jamie Gilbert [phone]  Jamie Gilbert called to say that her left eye is swollen and red. It does not itch or hurt. When she woke up this morning it was matted shut. She did state [place] is living in [place] construction zone, and this may be what caused it.

## 2019-07-10 NOTE — Telephone Encounter (Signed)
After speaking with Dr Renold Genta I called and scheduled an appointment.

## 2019-07-12 NOTE — Patient Instructions (Signed)
Warm hot compresses 15 to 20 minutes 2-3 times daily left area.  Ocuflox ophthalmic drops 2 drops in left eye 4 times a day for 5 to 7-day days

## 2019-07-12 NOTE — Progress Notes (Signed)
   Subjective:    Patient ID: Jamie Gilbert, female    DOB: Jul 14, 1956, 63 y.o.   MRN: [id number]  HPI  63 year old Female with swelling left upper eyelid.  No known trauma to the.  Has had some drainage noted in the corner of the left eye.  No fever.  No recent respiratory infection.  No headache.  No visual disturbances.  Does not recall having had conjunctivitis previously.  Rachell Cipro have been in her home and she has been confined to her basement while repairs and remodeling are being completed.    Review of Systems See above.  She has history of breast cancer but is doing well     Objective:   Physical Exam She is slightly anxious today.  Blood pressure 140/100  She has slight swelling of left upper eyelid with erythema.  Extraocular movements left are within normal limits.  PERRLA.  No drainage noted in corner of.  She is afebrile.       Assessment & Plan:  Conjunctivitis left-acute bacterial  Plan: Warm hot compresses to left area 15 to 20 minutes 2 or 3 times daily.  Ocuflox ophthalmic drops 2 drops in left eye 4 times a day for 5-7 days.

## 2019-07-24 ENCOUNTER — Ambulatory Visit (INDEPENDENT_AMBULATORY_CARE_PROVIDER_SITE_OTHER): Payer: 59 | Admitting: Internal Medicine

## 2019-07-24 ENCOUNTER — Encounter: Payer: Self-pay | Admitting: Internal Medicine

## 2019-07-24 ENCOUNTER — Other Ambulatory Visit: Payer: Self-pay

## 2019-07-24 VITALS — BP 140/60 | HR 64 | Temp 97.8°F | Ht 70.0 in | Wt 170.0 lb

## 2019-07-24 DIAGNOSIS — H00015 Hordeolum externum left lower eyelid: Secondary | ICD-10-CM

## 2019-07-24 MED ORDER — OFLOXACIN 0.3 % OP SOLN: 2.0000 [drp] | Freq: Four times a day (QID) | OPHTHALMIC | 1 refills | Status: DC

## 2019-07-24 MED ORDER — DOXYCYCLINE HYCLATE 100 MG PO TABS: 100.0000 mg | ORAL_TABLET | Freq: Two times a day (BID) | ORAL | 0 refills | Status: DC

## 2019-07-24 NOTE — Telephone Encounter (Signed)
We can look at it today 

## 2019-07-24 NOTE — Progress Notes (Signed)
   Subjective:    Patient ID: Marcine Matar, female    DOB: 1956-07-06, 63 y.o.   MRN: [id number]  HPI Patient was seen October 22 with swelling and erythema left upper eyelid.  Was diagnosed with conjunctivitis.  Was treated with warm compresses and Ocuflox ophthalmic drops and improved but today has pustule left lower eyelid. Says she washes face with soap daily.  Has been living in her basement due to repairs being done in her for some time now.    Review of Systems history of breast cancer but doing well     Objective:   Physical Exam She is afebrile.  Has pustule left lower eyelid.  Left sclera injected.  No drainage from left eye.       Assessment & Plan:  Hordeolum left eye  Plan: Warm hot compresses for 20 minutes 2-3 times daily.  Doxycycline 100 mg twice daily for 10 days.  Ocuflox ophthalmic drops twice daily for 10 days days.  Clean eyelashes with warm soapy water daily.

## 2019-07-24 NOTE — Patient Instructions (Signed)
Warm hot compresses 20 minutes twice a day to right eye. Doxycycline 100 mg twice a day for 10 days. Ocuflox ophthalmic drops 2 drops right eye 4 times a day x 10 days. Clean eyelashes with warm soapy water daily.

## 2019-07-24 NOTE — Telephone Encounter (Signed)
Patient called back to say now she has bump on left eye lid that looks like a zit with a whitehead.

## 2019-08-05 ENCOUNTER — Other Ambulatory Visit: Payer: Self-pay

## 2019-08-05 ENCOUNTER — Ambulatory Visit
Admission: RE | Admit: 2019-08-05 | Discharge: 2019-08-05 | Disposition: A | Discharge status: Home / Self Care | Payer: 59 | Source: Ambulatory Visit | Attending: Oncology | Admitting: Oncology

## 2019-08-05 DIAGNOSIS — R921 Mammographic calcification found on diagnostic imaging of breast: Secondary | ICD-10-CM

## 2019-08-26 NOTE — Progress Notes (Signed)
No show

## 2019-08-27 ENCOUNTER — Encounter: Payer: Self-pay | Admitting: Oncology

## 2019-08-27 ENCOUNTER — Inpatient Hospital Stay: Payer: 59 | Attending: Oncology | Admitting: Oncology

## 2019-08-27 DIAGNOSIS — D0512 Intraductal carcinoma in situ of left breast: Secondary | ICD-10-CM

## 2019-10-13 ENCOUNTER — Other Ambulatory Visit: Payer: Self-pay

## 2019-10-13 ENCOUNTER — Other Ambulatory Visit: Payer: Managed Care, Other (non HMO) | Admitting: Internal Medicine

## 2019-10-13 DIAGNOSIS — E559 Vitamin D deficiency, unspecified: Secondary | ICD-10-CM

## 2019-10-13 DIAGNOSIS — Z86000 Personal history of in-situ neoplasm of breast: Secondary | ICD-10-CM

## 2019-10-13 DIAGNOSIS — Z0000 Encounter for general adult medical examination without abnormal findings: Secondary | ICD-10-CM

## 2019-10-13 DIAGNOSIS — Z87891 Personal history of nicotine dependence: Secondary | ICD-10-CM

## 2019-10-13 DIAGNOSIS — M8580 Other specified disorders of bone density and structure, unspecified site: Secondary | ICD-10-CM

## 2019-10-13 DIAGNOSIS — E7800 Pure hypercholesterolemia, unspecified: Secondary | ICD-10-CM

## 2019-10-13 DIAGNOSIS — Z1329 Encounter for screening for other suspected endocrine disorder: Secondary | ICD-10-CM

## 2019-10-13 LAB — CBC WITH DIFFERENTIAL/PLATELET
Absolute Monocytes: 347 cells/uL (ref 200–950)
Basophils Absolute: 61 cells/uL (ref 0–200)
Basophils Relative: 1.2 %
Eosinophils Absolute: 31 cells/uL (ref 15–500)
Eosinophils Relative: 0.6 %
HCT: 39.3 % (ref 35.0–45.0)
Hemoglobin: 13.1 g/dL (ref 11.7–15.5)
Lymphs Abs: 1607 cells/uL (ref 850–3900)
MCH: 32.3 pg (ref 27.0–33.0)
MCHC: 33.3 g/dL (ref 32.0–36.0)
MCV: 96.8 fL (ref 80.0–100.0)
MPV: 9.5 fL (ref 7.5–12.5)
Monocytes Relative: 6.8 %
Neutro Abs: 3055 cells/uL (ref 1500–7800)
Neutrophils Relative %: 59.9 %
Platelets: 339 10*3/uL (ref 140–400)
RBC: 4.06 10*6/uL (ref 3.80–5.10)
RDW: 12.5 % (ref 11.0–15.0)
Total Lymphocyte: 31.5 %
WBC: 5.1 10*3/uL (ref 3.8–10.8)

## 2019-10-13 LAB — COMPLETE METABOLIC PANEL WITH GFR
AG Ratio: 2 (calc) (ref 1.0–2.5)
ALT: 16 U/L (ref 6–29)
AST: 15 U/L (ref 10–35)
Albumin: 4.4 g/dL (ref 3.6–5.1)
Alkaline phosphatase (APISO): 57 U/L (ref 37–153)
BUN: 11 mg/dL (ref 7–25)
CO2: 27 mmol/L (ref 20–32)
Calcium: 9.2 mg/dL (ref 8.6–10.4)
Chloride: 104 mmol/L (ref 98–110)
Creat: 0.75 mg/dL (ref 0.50–0.99)
GFR, Est African American: 98 mL/min/{1.73_m2} (ref >=60)
GFR, Est Non African American: 84 mL/min/{1.73_m2} (ref >=60)
Globulin: 2.2 g/dL (calc) (ref 1.9–3.7)
Glucose, Bld: 96 mg/dL (ref 65–99)
Potassium: 4.7 mmol/L (ref 3.5–5.3)
Sodium: 138 mmol/L (ref 135–146)
Total Bilirubin: 0.4 mg/dL (ref 0.2–1.2)
Total Protein: 6.6 g/dL (ref 6.1–8.1)

## 2019-10-13 LAB — LIPID PANEL
Cholesterol: 318 mg/dL — ABNORMAL HIGH (ref <200)
HDL: 60 mg/dL (ref >=50)
LDL Cholesterol (Calc): 238 mg/dL (calc) — ABNORMAL HIGH
Non-HDL Cholesterol (Calc): 258 mg/dL (calc) — ABNORMAL HIGH (ref <130)
Total CHOL/HDL Ratio: 5.3 (calc) — ABNORMAL HIGH (ref <5.0)
Triglycerides: 84 mg/dL (ref <150)

## 2019-10-13 LAB — TSH: TSH: 2.2 mIU/L (ref 0.40–4.50)

## 2019-10-13 LAB — VITAMIN D 25 HYDROXY (VIT D DEFICIENCY, FRACTURES): Vit D, 25-Hydroxy: 10 ng/mL — ABNORMAL LOW (ref 30–100)

## 2019-10-14 ENCOUNTER — Ambulatory Visit (INDEPENDENT_AMBULATORY_CARE_PROVIDER_SITE_OTHER): Payer: Managed Care, Other (non HMO) | Admitting: Internal Medicine

## 2019-10-14 ENCOUNTER — Other Ambulatory Visit (HOSPITAL_COMMUNITY)
Admission: RE | Admit: 2019-10-14 | Discharge: 2019-10-14 | Disposition: A | Discharge status: Home / Self Care | Payer: Managed Care, Other (non HMO) | Source: Ambulatory Visit | Attending: Internal Medicine | Admitting: Internal Medicine

## 2019-10-14 VITALS — BP 120/80 | HR 71 | Temp 98.0°F | Ht 70.0 in | Wt 174.0 lb

## 2019-10-14 DIAGNOSIS — Z87891 Personal history of nicotine dependence: Secondary | ICD-10-CM

## 2019-10-14 DIAGNOSIS — M8580 Other specified disorders of bone density and structure, unspecified site: Secondary | ICD-10-CM

## 2019-10-14 DIAGNOSIS — E559 Vitamin D deficiency, unspecified: Secondary | ICD-10-CM | POA: Diagnosis not present

## 2019-10-14 DIAGNOSIS — Z124 Encounter for screening for malignant neoplasm of cervix: Secondary | ICD-10-CM | POA: Diagnosis present

## 2019-10-14 DIAGNOSIS — E7800 Pure hypercholesterolemia, unspecified: Secondary | ICD-10-CM

## 2019-10-14 DIAGNOSIS — Z853 Personal history of malignant neoplasm of breast: Secondary | ICD-10-CM

## 2019-10-14 DIAGNOSIS — M7918 Myalgia, other site: Secondary | ICD-10-CM

## 2019-10-14 DIAGNOSIS — Z0000 Encounter for general adult medical examination without abnormal findings: Secondary | ICD-10-CM | POA: Diagnosis not present

## 2019-10-14 LAB — POCT URINALYSIS DIPSTICK
Appearance: NEGATIVE
Bilirubin, UA: NEGATIVE
Blood, UA: NEGATIVE
Glucose, UA: NEGATIVE
Ketones, UA: NEGATIVE
Leukocytes, UA: NEGATIVE
Nitrite, UA: NEGATIVE
Odor: NEGATIVE
Protein, UA: NEGATIVE
Spec Grav, UA: 1.015 (ref 1.010–1.025)
Urobilinogen, UA: 0.2 E.U./dL
pH, UA: 6 (ref 5.0–8.0)

## 2019-10-14 MED ORDER — IBUPROFEN 800 MG PO TABS: 800.0000 mg | ORAL_TABLET | Freq: Three times a day (TID) | ORAL | 0 refills | Status: DC | PRN

## 2019-10-14 MED ORDER — ERGOCALCIFEROL 1.25 MG (50000 UT) PO CAPS: 50000.0000 [IU] | ORAL_CAPSULE | ORAL | 3 refills | Status: DC

## 2019-10-14 MED ORDER — ROSUVASTATIN CALCIUM 5 MG PO TABS: 5.0000 mg | ORAL_TABLET | Freq: Every day | ORAL | 0 refills | Status: DC

## 2019-10-14 NOTE — Progress Notes (Signed)
Subjective:    Patient ID: Jamie Gilbert, female    DOB: 14-Apr-1956, 64 y.o.   MRN: [id number]  HPI 64 year old Female for health maintenance exam and evaluation of medical issues.  Review of lab work shows significant hyperlipidemia.  Total cholesterol was 318 and LDL cholesterol 238.  Had similar findings in December 2019.  HDL was 60 and triglycerides were 84.  She was started on Crestor 5 mg daily and follow-up in 3 months.  She ran out of high-dose vitamin D and has not been taking it.  Her level is 10.  She will be restarted on high-dose vitamin D indefinitely.  We will have repeat level in 3 months.  She will also have office visit at that time.  History of smoking.  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  Social history: She is married.  Social alcohol consumption.  2 adult daughters.  Husband is retired from [employer] business.  They have been remodeling their home for several months during the pandemic and living in their basement which has been stressful.  This is her second marriage.  Family history: Mother died with abdominal aortic aneurysm around age 59.  She had a history of hypertension was a former smoker.  Father died at age 67 with a brain tumor.  3 brothers in their 73s in good health with exception of hyperlipidemia.  No sisters.  History of right middle lobe pneumonia January 2000.  Infectious colitis February 2000.  Ectopic pregnancy and bilateral tubal ligation 1990.  Torn medial meniscus of left knee with plica being excised 0000000 and subsequently developing regional pain syndrome post surgery.  History of abnormal mammogram 2011 with benign breast biopsy.  In 2011 she was started on Zocor but has not been compliant with diet.  History of vitamin D deficiency.  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  She had lumpectomy January 2018 and completed adjuvant radiation therapy.  Antiestrogen therapy  was recommended.  She did poorly with anastrozole so she was switched to exemestane but was not motivated to stay on it.  Needs to have screening colonoscopy.  She is followed by Dr. Jana Hakim.      Review of Systems has musculoskeletal pain treated with Motrin 800 mg on a as needed basis and this was refilled today.  Has not been able to get COVID-19 vaccine.  Patient is to call cancer center to see if they have any special arrangements for vaccine.     Objective:   Physical Exam Vitals reviewed.  Constitutional:      Appearance: Normal appearance.  HENT:     Head: Normocephalic and atraumatic.     Right Ear: Tympanic membrane normal.     Left Ear: Tympanic membrane normal.     Nose: Nose normal.  Eyes:     General: No scleral icterus.    Extraocular Movements: Extraocular movements intact.     Conjunctiva/sclera: Conjunctivae normal.     Pupils: Pupils are equal, round, and reactive to light.  Cardiovascular:     Rate and Rhythm: Normal rate and regular rhythm.     Heart sounds: Normal heart sounds. No murmur.     Comments: Breasts without masses Pulmonary:     Effort: No respiratory distress.     Breath sounds: No wheezing.  Abdominal:     General: Bowel sounds are normal. There is no distension.     Palpations: Abdomen is soft.  Tenderness: There is no abdominal tenderness. There is no guarding.  Genitourinary:    Comments: Pap taken.  Bimanual normal. Musculoskeletal:     Cervical back: Neck supple. No rigidity.     Right lower leg: No edema.     Left lower leg: No edema.  Lymphadenopathy:     Cervical: No cervical adenopathy.  Skin:    General: Skin is warm and dry.     Findings: No rash.  Neurological:     General: No focal deficit present.     Mental Status: She is alert and oriented to person, place, and time.     Cranial Nerves: No cranial nerve deficit.     Coordination: Coordination normal.     Gait: Gait normal.  Psychiatric:        Mood and  Affect: Mood normal.        Behavior: Behavior normal.        Thought Content: Thought content normal.        Judgment: Judgment normal.           Assessment & Plan:   Vitamin D deficiency- take 50,000 units weekly have refilled for one year. Level is 10. Ran out of Rx  Pure hypercholesterolemia- Start Crestor 5 mg 3 times a week.  Follow up with Vitamin D and lipid liver in 3 months  Call [facility] about Covid-19 vaccine  History of breast cancer followed by Oncology  History of smoking-needs to quit  Health maintenance needs colonoscopy  Having musculoskeletal pain for which she may take Motrin  Plan: Follow-up here in 3 months.

## 2019-10-14 NOTE — Patient Instructions (Signed)
Refill Motrin for musculoskeletal pain.  Start Crestor 5 mg daily with supper.  Take 50,000units weekly for vitamin D deficiency.  Follow-up in 3 months.  Hold off on tetanus immunization update

## 2019-10-16 LAB — CYTOLOGY - PAP
Comment: NEGATIVE
Diagnosis: NEGATIVE
High risk HPV: NEGATIVE

## 2019-10-19 ENCOUNTER — Encounter: Payer: Self-pay | Admitting: Internal Medicine

## 2019-12-18 ENCOUNTER — Other Ambulatory Visit: Payer: Self-pay

## 2019-12-18 ENCOUNTER — Other Ambulatory Visit: Payer: 59 | Admitting: Internal Medicine

## 2019-12-18 DIAGNOSIS — E559 Vitamin D deficiency, unspecified: Secondary | ICD-10-CM

## 2019-12-18 DIAGNOSIS — E7800 Pure hypercholesterolemia, unspecified: Secondary | ICD-10-CM

## 2019-12-18 LAB — HEPATIC FUNCTION PANEL
AG Ratio: 1.8 (calc) (ref 1.0–2.5)
ALT: 14 U/L (ref 6–29)
AST: 16 U/L (ref 10–35)
Albumin: 4.2 g/dL (ref 3.6–5.1)
Alkaline phosphatase (APISO): 63 U/L (ref 37–153)
Bilirubin, Direct: 0.1 mg/dL (ref 0.0–0.2)
Globulin: 2.4 g/dL (calc) (ref 1.9–3.7)
Indirect Bilirubin: 0.4 mg/dL (calc) (ref 0.2–1.2)
Total Bilirubin: 0.5 mg/dL (ref 0.2–1.2)
Total Protein: 6.6 g/dL (ref 6.1–8.1)

## 2019-12-18 LAB — LIPID PANEL
Cholesterol: 250 mg/dL — ABNORMAL HIGH (ref <200)
HDL: 63 mg/dL (ref >=50)
LDL Cholesterol (Calc): 167 mg/dL (calc) — ABNORMAL HIGH
Non-HDL Cholesterol (Calc): 187 mg/dL (calc) — ABNORMAL HIGH (ref <130)
Total CHOL/HDL Ratio: 4 (calc) (ref <5.0)
Triglycerides: 95 mg/dL (ref <150)

## 2019-12-18 LAB — VITAMIN D 25 HYDROXY (VIT D DEFICIENCY, FRACTURES): Vit D, 25-Hydroxy: 40 ng/mL (ref 30–100)

## 2019-12-23 ENCOUNTER — Other Ambulatory Visit: Payer: Self-pay

## 2019-12-23 ENCOUNTER — Encounter: Payer: Self-pay | Admitting: Internal Medicine

## 2019-12-23 ENCOUNTER — Ambulatory Visit (INDEPENDENT_AMBULATORY_CARE_PROVIDER_SITE_OTHER): Payer: 59 | Admitting: Internal Medicine

## 2019-12-23 VITALS — BP 120/70 | HR 76 | Temp 98.2°F | Ht 70.0 in | Wt 174.0 lb

## 2019-12-23 DIAGNOSIS — E7800 Pure hypercholesterolemia, unspecified: Secondary | ICD-10-CM | POA: Diagnosis not present

## 2019-12-23 DIAGNOSIS — E559 Vitamin D deficiency, unspecified: Secondary | ICD-10-CM | POA: Diagnosis not present

## 2019-12-23 DIAGNOSIS — Z853 Personal history of malignant neoplasm of breast: Secondary | ICD-10-CM

## 2019-12-23 DIAGNOSIS — Z87891 Personal history of nicotine dependence: Secondary | ICD-10-CM | POA: Diagnosis not present

## 2019-12-23 MED ORDER — ERGOCALCIFEROL 1.25 MG (50000 UT) PO CAPS: 50000.0000 [IU] | ORAL_CAPSULE | ORAL | 3 refills | Status: DC

## 2019-12-23 MED ORDER — ROSUVASTATIN CALCIUM 10 MG PO TABS: 10.0000 mg | ORAL_TABLET | Freq: Every day | ORAL | 3 refills | Status: DC

## 2019-12-23 NOTE — Progress Notes (Signed)
   Subjective:    Patient ID: Marcine Matar, female    DOB: [birth year], 64 y.o.   MRN: [id number]  HPI 64 year old Female here for follow-up on hyperlipidemia and vitamin D deficiency.  Cholesterol has improved from 318 in January to 250.  LDL cholesterol has decreased from 238 to 167.  She has been on Crestor 5 mg 3 times a week.  I would like for her to start Crestor 10 mg daily.  She has been on high-dose vitamin D weekly and level has increased from 10-40.  She will remain on high-dose vitamin D on a weekly basis.  History of left breast cancer followed at [facility].  Review of Systems no new complaints.  Still living in basement of her house with spouse is undergoing repairs and remodeling.     Objective:   Physical Exam Blood pressure 120/70 pulse 76 temperature 98.2 degrees pulse oximetry 98% weight 174 pounds height 5 feet 10 inches BMI 24.97  She appears to be in no acute distress.  Tolerating Crestor without issues.       Assessment & Plan:   Pure hypercholesterolemia increase Crestor to 10 mg daily and follow-up in July.  Vitamin D deficiency improvement in level and will continue weekly indefinitely  History of breast cancer  History of smoking  Plan: Patient will stay on weekly vitamin D for the present time.  Increase Crestor to 10 mg daily and follow-up in 3 months.

## 2020-01-11 NOTE — Patient Instructions (Signed)
Increase Crestor to 10 mg daily.  Continue high-dose vitamin D supplement weekly.  Follow-up in July.

## 2020-03-26 ENCOUNTER — Other Ambulatory Visit: Payer: Self-pay

## 2020-03-26 ENCOUNTER — Other Ambulatory Visit: Payer: 59 | Admitting: Internal Medicine

## 2020-03-26 DIAGNOSIS — E7800 Pure hypercholesterolemia, unspecified: Secondary | ICD-10-CM

## 2020-03-27 LAB — HEPATIC FUNCTION PANEL
AG Ratio: 2.5 (calc) (ref 1.0–2.5)
ALT: 13 U/L (ref 6–29)
AST: 15 U/L (ref 10–35)
Albumin: 4.7 g/dL (ref 3.6–5.1)
Alkaline phosphatase (APISO): 56 U/L (ref 37–153)
Bilirubin, Direct: 0.1 mg/dL (ref 0.0–0.2)
Globulin: 1.9 g/dL (calc) (ref 1.9–3.7)
Indirect Bilirubin: 0.3 mg/dL (calc) (ref 0.2–1.2)
Total Bilirubin: 0.4 mg/dL (ref 0.2–1.2)
Total Protein: 6.6 g/dL (ref 6.1–8.1)

## 2020-03-27 LAB — LIPID PANEL
Cholesterol: 224 mg/dL — ABNORMAL HIGH (ref <200)
HDL: 70 mg/dL (ref >=50)
LDL Cholesterol (Calc): 139 mg/dL (calc) — ABNORMAL HIGH
Non-HDL Cholesterol (Calc): 154 mg/dL (calc) — ABNORMAL HIGH (ref <130)
Total CHOL/HDL Ratio: 3.2 (calc) (ref <5.0)
Triglycerides: 62 mg/dL (ref <150)

## 2020-03-29 ENCOUNTER — Ambulatory Visit (INDEPENDENT_AMBULATORY_CARE_PROVIDER_SITE_OTHER): Payer: 59 | Admitting: Internal Medicine

## 2020-03-29 ENCOUNTER — Encounter: Payer: Self-pay | Admitting: Internal Medicine

## 2020-03-29 ENCOUNTER — Other Ambulatory Visit: Payer: Self-pay

## 2020-03-29 VITALS — BP 110/80 | HR 81 | Ht 70.0 in | Wt 172.0 lb

## 2020-03-29 DIAGNOSIS — E7800 Pure hypercholesterolemia, unspecified: Secondary | ICD-10-CM | POA: Diagnosis not present

## 2020-03-29 DIAGNOSIS — Z853 Personal history of malignant neoplasm of breast: Secondary | ICD-10-CM

## 2020-03-29 DIAGNOSIS — E559 Vitamin D deficiency, unspecified: Secondary | ICD-10-CM

## 2020-03-29 MED ORDER — ROSUVASTATIN CALCIUM 20 MG PO TABS: 20.0000 mg | ORAL_TABLET | Freq: Every day | ORAL | 0 refills | Status: DC

## 2020-03-29 NOTE — Progress Notes (Signed)
° °  Subjective:    Patient ID: Jamie Gilbert, female    DOB: 11-Oct-1955, 64 y.o.   MRN: [id number]  HPI 64 year old Female here for follow up on hyperlipidemia.  At last visit in April, Crestor was increased to 10 mg daily.  She is now here for follow-up.  In April total cholesterol was 250 and is now 224.  LDL was 167 and is now 139.  Liver functions are normal.  She doesn't feel that she eats badly.  I really would like for her to get cholesterol under control so we are increasing to Crestor 20 mg daily.  She has some upcoming trips.  We will follow-up with her in October.  Generally feels well and is looking forward to traveling.  Needs Tdap vaccine update in the future.  She has a history of left breast cancer and is doing well.  She has a history of vitamin D deficiency and is on weekly high-dose vitamin D therapy Review of Systems no new complaints     Objective:   Physical Exam  Blood pressure 110/80 pulse 81 pulse oximetry 97% weight 172 pounds.  Neck supple without thyromegaly.  Chest clear.  Cardiac exam regular rate and rhythm.  No lower extremity edema.      Assessment & Plan:  Pure hypercholesterolemia-improved with Crestor 10 mg daily but not normal  Plan: Increase Crestor to 20 mg daily and follow-up in October.  Watch diet and get plenty of exercise.  Continue weekly vitamin D therapy 50,000 units.

## 2020-04-11 ENCOUNTER — Encounter: Payer: Self-pay | Admitting: Internal Medicine

## 2020-04-11 NOTE — Patient Instructions (Signed)
Increase Crestor to 20 mg daily and follow-up in October.  Continue high-dose vitamin D therapy weekly.  Need Tdap vaccine.

## 2020-04-14 ENCOUNTER — Encounter: Payer: Self-pay | Admitting: Internal Medicine

## 2020-04-14 ENCOUNTER — Ambulatory Visit (INDEPENDENT_AMBULATORY_CARE_PROVIDER_SITE_OTHER): Payer: 59 | Admitting: Internal Medicine

## 2020-04-14 ENCOUNTER — Telehealth: Payer: Self-pay | Admitting: Internal Medicine

## 2020-04-14 ENCOUNTER — Other Ambulatory Visit: Payer: Self-pay

## 2020-04-14 VITALS — Ht 70.0 in | Wt 172.0 lb

## 2020-04-14 DIAGNOSIS — R0981 Nasal congestion: Secondary | ICD-10-CM

## 2020-04-14 DIAGNOSIS — R05 Cough: Secondary | ICD-10-CM | POA: Diagnosis not present

## 2020-04-14 DIAGNOSIS — R059 Cough, unspecified: Secondary | ICD-10-CM

## 2020-04-14 MED ORDER — ALBUTEROL SULFATE HFA 108 (90 BASE) MCG/ACT IN AERS: 2.0000 | INHALATION_SPRAY | Freq: Four times a day (QID) | RESPIRATORY_TRACT | 1 refills | Status: DC | PRN

## 2020-04-14 MED ORDER — LEVOFLOXACIN 500 MG PO TABS: 500.0000 mg | ORAL_TABLET | Freq: Every day | ORAL | 0 refills | Status: DC

## 2020-04-14 NOTE — Patient Instructions (Signed)
Obtained left nasal swab for COVID-19 PCR testing.  Please go home and isolate until results are available.  Take Levaquin 500 mg daily for 10 days.  Use Ventolin inhaler 2 sprays 4 times daily as needed for wheezing.  Rest and drink plenty of fluids.  Would advise not traveling this weekend.

## 2020-04-14 NOTE — Progress Notes (Signed)
   Subjective:    Patient ID: Jamie Gilbert, female    DOB: 28-Aug-1956, 64 y.o.   MRN: [id number]  HPI 64 year old Female  Seen in her auto outside the office due to the [place] pandemic. She received 2 Pfizer vaccines in March and April for COVID-19.  She is about to travel on an airplane this coming weekend.  She called complaining of acute onset of respiratory congestion with coughing and discolored nasal drainage.  No documented fever.  She is a smoker.  She has a remote history of breast cancer.  She has vitamin D deficiency.  She is on Crestor for hyperlipidemia.  Husband is not sick.    Review of Systems no nausea or vomiting.  No dysgeusia.  No myalgias.     Objective:   Physical Exam She is afebrile.  She sounds nasally congested when she speaks and is coughing.  Her cough is congested.  Chest exam reveals bilateral rhonchi and wheezing.  Obtain nasal swab for COVID-19 outside the office in her car       Assessment & Plan:  Acute bronchospasm-have called in Ventolin inhaler for her 2 sprays by mouth 4 times daily as needed for wheezing   Acute maxillary sinusitis-have called in Levaquin 500 mg daily for 10 days  History of left breast cancer-currently not on treatment.  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  She had lumpectomy in January 2018 followed by radiation treatment.  Continues to smoke-not interested in quitting  Due to possible exposure to delta variant of the COVID-19 virus she had a COVID-19 test PCR with results pending.  I do not think is a good idea for her to travel by plane this coming weekend.  Rest and drink plenty of fluids.  Call if symptoms worsen.

## 2020-04-14 NOTE — Telephone Encounter (Signed)
Jozie Wulf [phone]  Mita called to say that on Monday evening she started having sinus infection symptoms, croupy cough, coughing up stuff, blowing out green yellow stuff, has running nose, left ear feels clogged when she gets up but once she blows her nose it feels better. No fever that she knows of, has not taking temperature, No COVID exposure that she knows of, COVID vaccines back in March and April.

## 2020-04-14 NOTE — Telephone Encounter (Signed)
See if she can come at 11:15 so we can dress in PPE once today.

## 2020-04-14 NOTE — Telephone Encounter (Signed)
scheduled

## 2020-04-16 LAB — SARS-COV-2 RNA,(COVID-19) QUALITATIVE NAAT: SARS CoV2 RNA: NOT DETECTED

## 2020-06-28 ENCOUNTER — Other Ambulatory Visit: Payer: Self-pay

## 2020-06-28 ENCOUNTER — Other Ambulatory Visit: Payer: 59 | Admitting: Internal Medicine

## 2020-06-28 DIAGNOSIS — E7800 Pure hypercholesterolemia, unspecified: Secondary | ICD-10-CM

## 2020-06-28 LAB — HEPATIC FUNCTION PANEL
AG Ratio: 2 (calc) (ref 1.0–2.5)
ALT: 14 U/L (ref 6–29)
AST: 15 U/L (ref 10–35)
Albumin: 4.6 g/dL (ref 3.6–5.1)
Alkaline phosphatase (APISO): 58 U/L (ref 37–153)
Bilirubin, Direct: 0.1 mg/dL (ref 0.0–0.2)
Globulin: 2.3 g/dL (calc) (ref 1.9–3.7)
Indirect Bilirubin: 0.2 mg/dL (calc) (ref 0.2–1.2)
Total Bilirubin: 0.3 mg/dL (ref 0.2–1.2)
Total Protein: 6.9 g/dL (ref 6.1–8.1)

## 2020-06-28 LAB — LIPID PANEL
Cholesterol: 282 mg/dL — ABNORMAL HIGH (ref <200)
HDL: 68 mg/dL (ref >=50)
LDL Cholesterol (Calc): 195 mg/dL (calc) — ABNORMAL HIGH
Non-HDL Cholesterol (Calc): 214 mg/dL (calc) — ABNORMAL HIGH (ref <130)
Total CHOL/HDL Ratio: 4.1 (calc) (ref <5.0)
Triglycerides: 84 mg/dL (ref <150)

## 2020-06-29 ENCOUNTER — Ambulatory Visit (INDEPENDENT_AMBULATORY_CARE_PROVIDER_SITE_OTHER): Payer: 59 | Admitting: Internal Medicine

## 2020-06-29 ENCOUNTER — Encounter: Payer: Self-pay | Admitting: Internal Medicine

## 2020-06-29 VITALS — BP 120/80 | HR 68 | Ht 70.0 in | Wt 172.0 lb

## 2020-06-29 DIAGNOSIS — E7800 Pure hypercholesterolemia, unspecified: Secondary | ICD-10-CM | POA: Diagnosis not present

## 2020-06-29 DIAGNOSIS — Z9119 Patient's noncompliance with other medical treatment and regimen: Secondary | ICD-10-CM | POA: Diagnosis not present

## 2020-06-29 DIAGNOSIS — Z91199 Patient's noncompliance with other medical treatment and regimen due to unspecified reason: Secondary | ICD-10-CM

## 2020-06-29 NOTE — Patient Instructions (Signed)
Physical examination due in late January.  Okay to wait until February if traveling.  Please take cholesterol medication as prescribed and watch diet.

## 2020-06-29 NOTE — Progress Notes (Signed)
   Subjective:    Patient ID: Jamie Gilbert, female    DOB: [birth year], 64 y.o.   MRN: [id number]  HPI  64 year old  Female for follow up on hyperlipidemia.She is on Crestor 20 mg daily.  Due to renovations on her home she continues to live in her basement with her husband.  She finds this confining and may not be following a low-fat diet.  When she was here in July, total cholesterol was 224 with an LDL cholesterol of 139.  Liver functions were normal.  At that point we increased Lipitor from 10 to 20 mg daily.  She has a history of left breast cancer and is doing well.  Despite increasing Lipitor from 10 to 20 mg daily, her numbers have worsened.  Total cholesterol is 282 and LDL cholesterol is 195.  I am concerned she may not be taking the medication on a regular basis given this increase in LDL and total cholesterol.  HDL is 68 and triglycerides are 84.  Liver functions are normal.  At this point, I think we should give her some time to get her living situation straightened out.  She has some upcoming trips.  She needs to get serious about diet and exercise.  Appointment has been made for health maintenance exam in late January and we will follow-up with her at that point in time.  She has had 2 COVID-19 immunizations.  Review of Systems no new complaints     Objective:   Physical Exam  Blood pressure 120/80 pulse 68 pulse oximetry 95% weight 172 pounds height 5 feet 10 inches BMI 24.68 Not examined today but spent 10 minutes speaking with her about these issues.     Assessment & Plan:  Pure hypercholesterolemia-not responding to Crestor 20 mg daily-it was increased from 10 mg daily at last visit.  Plan: Encourage patient to watch diet and get more exercise.  However she has some upcoming trips and may not watch her diet carefully during those trips.  We will reevaluate in January 2022.

## 2020-07-02 ENCOUNTER — Other Ambulatory Visit: Payer: Self-pay | Admitting: Oncology

## 2020-07-02 DIAGNOSIS — Z9889 Other specified postprocedural states: Secondary | ICD-10-CM

## 2020-08-05 ENCOUNTER — Ambulatory Visit
Admission: RE | Admit: 2020-08-05 | Discharge: 2020-08-05 | Disposition: A | Discharge status: Home / Self Care | Payer: 59 | Source: Ambulatory Visit | Attending: Oncology | Admitting: Oncology

## 2020-08-05 ENCOUNTER — Other Ambulatory Visit: Payer: Self-pay

## 2020-08-05 DIAGNOSIS — Z9889 Other specified postprocedural states: Secondary | ICD-10-CM

## 2020-09-20 ENCOUNTER — Telehealth: Payer: Self-pay

## 2020-09-20 NOTE — Telephone Encounter (Signed)
Called to scheduled, she has got to go and pick up dig because they have no power, she will call when she gets back, will do today if she gets back in time, if not will schedule for tomorrow.

## 2020-09-20 NOTE — Telephone Encounter (Signed)
Patient is traveling to the [place] on Sunday and would like to have a prescription for food poisoning and 800mg  ibuprofen.

## 2020-09-20 NOTE — Telephone Encounter (Signed)
Virtual visit 

## 2020-09-21 ENCOUNTER — Telehealth (INDEPENDENT_AMBULATORY_CARE_PROVIDER_SITE_OTHER): Payer: 59 | Admitting: Internal Medicine

## 2020-09-21 ENCOUNTER — Encounter: Payer: Self-pay | Admitting: Internal Medicine

## 2020-09-21 ENCOUNTER — Other Ambulatory Visit: Payer: Self-pay

## 2020-09-21 DIAGNOSIS — Z87891 Personal history of nicotine dependence: Secondary | ICD-10-CM | POA: Diagnosis not present

## 2020-09-21 DIAGNOSIS — E7800 Pure hypercholesterolemia, unspecified: Secondary | ICD-10-CM | POA: Diagnosis not present

## 2020-09-21 DIAGNOSIS — M7918 Myalgia, other site: Secondary | ICD-10-CM

## 2020-09-21 DIAGNOSIS — Z7184 Encounter for health counseling related to travel: Secondary | ICD-10-CM

## 2020-09-21 MED ORDER — METRONIDAZOLE 500 MG PO TABS: 500.0000 mg | ORAL_TABLET | Freq: Two times a day (BID) | ORAL | 0 refills | Status: DC

## 2020-09-21 MED ORDER — CIPROFLOXACIN HCL 500 MG PO TABS: 500.0000 mg | ORAL_TABLET | Freq: Two times a day (BID) | ORAL | 0 refills | Status: DC

## 2020-09-21 MED ORDER — IBUPROFEN 800 MG PO TABS
800.0000 mg | ORAL_TABLET | Freq: Three times a day (TID) | ORAL | 0 refills | Status: DC | PRN
Start: 2020-09-21 — End: 2021-12-16

## 2020-09-21 MED ORDER — ONDANSETRON HCL 4 MG PO TABS: 4.0000 mg | ORAL_TABLET | Freq: Three times a day (TID) | ORAL | 0 refills | Status: DC | PRN

## 2020-09-21 NOTE — Progress Notes (Addendum)
   Subjective:    Patient ID: Jamie Gilbert, female    DOB: 1956/03/26, 65 y.o.   MRN: [id number]  HPI 65 year old Female with history of breast cancer followed by Dr. Darnelle Catalan.  Patient is traveling by airplane to the [place] this coming Sunday and will be on a boat there for about a week with other people.  Then in March, she will be traveling to [place] for 3 weeks.  She would like medications for travel.  She also would like a refill on her Motrin for musculoskeletal pain.   Records indicate she has had 2 COVID-19 immunizations.  She has a history of smoking.  Not interested in quitting.  History of left breast ductal carcinoma in situ in the Fall 2017.  Tumor was estrogen receptor positive, progesterone receptor positive.  She had lumpectomy in January 2018 followed by radiation treatment.  History of hyperlipidemia and has been prescribed Crestor 20 mg daily.  History of right middle lobe pneumonia January 2000.  Infectious colitis in February 2000.  Due to the Coronavirus pandemic, she was scheduled for virtual visit today.  She is identified using 2 identifiers as Jamie Gilbert, a longstanding patient in this practice.  She is agreeable to visit in this format today.  However, virtual attempts at connection failed, and we were forced to continue with audio alone.  Patient is concerned about possibility of food poisoning on this trip and would like antibiotics to have on hand should that event occur.  She also needs Motrin for musculoskeletal pain.  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    Review of Systems see above     Objective:   Physical Exam Not examined but questioned by telephone and she is having no symptoms of COVID-19 at the present time and is feeling well.       Assessment & Plan:  Travel advice encounter-she was prescribed Cipro 500 mg twice daily  for 7 days which should cover respiratory infection or bacterial, urinary infection, or foodborne infection foodborne infection.  Also prescribed Flagyl 500 mg twice daily for 7 days for possible foodborne infection as well.  Prescribe Zofran 4 mg tablets #21 every 8 hours if needed for nausea.  Ibuprofen 800 mg 3 times a day #90 with no refill ordered for musculoskeletal pain while on trip.  Hyperlipidemia-continue Crestor 20 mg daily  History of breast cancer followed by oncology  Vitamin D deficiency: She is on high-dose vitamin D 50,000 units daily.  Has CPE scheduled here for February.  Addendum: Time spent on phone with patient is 10 minutes. MJB,MD

## 2020-09-21 NOTE — Telephone Encounter (Signed)
scheduled

## 2020-09-22 ENCOUNTER — Encounter: Payer: Self-pay | Admitting: Internal Medicine

## 2020-09-22 NOTE — Patient Instructions (Addendum)
I have prescribed Zofran for nausea, Cipro 500 mg twice daily for respiratory infection.  Flagyl 500 mg twice daily for 7 days for possible foodborne infection.  Ibuprofen 800 mg up to 3 times daily as needed for musculoskeletal pain.  Has CPE scheduled here for February.

## 2020-10-12 ENCOUNTER — Other Ambulatory Visit: Payer: 59 | Admitting: Internal Medicine

## 2020-10-15 ENCOUNTER — Encounter: Payer: 59 | Admitting: Internal Medicine

## 2020-10-25 ENCOUNTER — Other Ambulatory Visit: Payer: 59 | Admitting: Internal Medicine

## 2020-10-25 DIAGNOSIS — E559 Vitamin D deficiency, unspecified: Secondary | ICD-10-CM

## 2020-10-25 DIAGNOSIS — E7800 Pure hypercholesterolemia, unspecified: Secondary | ICD-10-CM

## 2020-10-25 DIAGNOSIS — Z853 Personal history of malignant neoplasm of breast: Secondary | ICD-10-CM

## 2020-10-25 DIAGNOSIS — M8580 Other specified disorders of bone density and structure, unspecified site: Secondary | ICD-10-CM

## 2020-10-25 DIAGNOSIS — M7918 Myalgia, other site: Secondary | ICD-10-CM

## 2020-10-25 DIAGNOSIS — Z86000 Personal history of in-situ neoplasm of breast: Secondary | ICD-10-CM

## 2020-10-25 DIAGNOSIS — Z0000 Encounter for general adult medical examination without abnormal findings: Secondary | ICD-10-CM

## 2020-10-25 DIAGNOSIS — Z87891 Personal history of nicotine dependence: Secondary | ICD-10-CM

## 2020-10-26 ENCOUNTER — Other Ambulatory Visit: Payer: 59 | Admitting: Internal Medicine

## 2020-10-28 ENCOUNTER — Encounter: Payer: 59 | Admitting: Internal Medicine

## 2020-11-02 ENCOUNTER — Other Ambulatory Visit: Payer: Self-pay | Admitting: Internal Medicine

## 2021-06-23 ENCOUNTER — Other Ambulatory Visit: Payer: Self-pay | Admitting: Oncology

## 2021-06-23 DIAGNOSIS — Z853 Personal history of malignant neoplasm of breast: Secondary | ICD-10-CM

## 2021-08-09 ENCOUNTER — Ambulatory Visit
Admission: RE | Admit: 2021-08-09 | Discharge: 2021-08-09 | Disposition: A | Discharge status: Home / Self Care | Payer: Medicare Other | Source: Ambulatory Visit | Attending: Oncology | Admitting: Oncology

## 2021-08-09 DIAGNOSIS — Z853 Personal history of malignant neoplasm of breast: Secondary | ICD-10-CM

## 2021-10-13 ENCOUNTER — Other Ambulatory Visit: Payer: Medicare Other | Admitting: Internal Medicine

## 2021-10-13 ENCOUNTER — Other Ambulatory Visit: Payer: Self-pay

## 2021-10-13 DIAGNOSIS — Z87891 Personal history of nicotine dependence: Secondary | ICD-10-CM

## 2021-10-13 DIAGNOSIS — Z0000 Encounter for general adult medical examination without abnormal findings: Secondary | ICD-10-CM

## 2021-10-13 DIAGNOSIS — E7800 Pure hypercholesterolemia, unspecified: Secondary | ICD-10-CM

## 2021-10-13 LAB — CBC WITH DIFFERENTIAL/PLATELET
Absolute Monocytes: 447 cells/uL (ref 200–950)
Basophils Absolute: 58 cells/uL (ref 0–200)
Basophils Relative: 1 %
Eosinophils Absolute: 41 cells/uL (ref 15–500)
Eosinophils Relative: 0.7 %
HCT: 39.8 % (ref 35.0–45.0)
Hemoglobin: 13.4 g/dL (ref 11.7–15.5)
Lymphs Abs: 1525 cells/uL (ref 850–3900)
MCH: 32 pg (ref 27.0–33.0)
MCHC: 33.7 g/dL (ref 32.0–36.0)
MCV: 95 fL (ref 80.0–100.0)
MPV: 9.2 fL (ref 7.5–12.5)
Monocytes Relative: 7.7 %
Neutro Abs: 3729 cells/uL (ref 1500–7800)
Neutrophils Relative %: 64.3 %
Platelets: 363 10*3/uL (ref 140–400)
RBC: 4.19 10*6/uL (ref 3.80–5.10)
RDW: 12.5 % (ref 11.0–15.0)
Total Lymphocyte: 26.3 %
WBC: 5.8 10*3/uL (ref 3.8–10.8)

## 2021-10-13 LAB — COMPLETE METABOLIC PANEL WITH GFR
AG Ratio: 1.8 (calc) (ref 1.0–2.5)
ALT: 15 U/L (ref 6–29)
AST: 16 U/L (ref 10–35)
Albumin: 4.4 g/dL (ref 3.6–5.1)
Alkaline phosphatase (APISO): 59 U/L (ref 37–153)
BUN: 13 mg/dL (ref 7–25)
CO2: 31 mmol/L (ref 20–32)
Calcium: 9.5 mg/dL (ref 8.6–10.4)
Chloride: 103 mmol/L (ref 98–110)
Creat: 0.85 mg/dL (ref 0.50–1.05)
Globulin: 2.4 g/dL (calc) (ref 1.9–3.7)
Glucose, Bld: 98 mg/dL (ref 65–99)
Potassium: 5.1 mmol/L (ref 3.5–5.3)
Sodium: 139 mmol/L (ref 135–146)
Total Bilirubin: 0.4 mg/dL (ref 0.2–1.2)
Total Protein: 6.8 g/dL (ref 6.1–8.1)
eGFR: 76 mL/min/{1.73_m2} (ref >=60)

## 2021-10-13 LAB — LIPID PANEL
Cholesterol: 330 mg/dL — ABNORMAL HIGH (ref <200)
HDL: 65 mg/dL (ref >=50)
LDL Cholesterol (Calc): 244 mg/dL (calc) — ABNORMAL HIGH
Non-HDL Cholesterol (Calc): 265 mg/dL (calc) — ABNORMAL HIGH (ref <130)
Total CHOL/HDL Ratio: 5.1 (calc) — ABNORMAL HIGH (ref <5.0)
Triglycerides: 91 mg/dL (ref <150)

## 2021-10-13 LAB — TSH: TSH: 1.9 mIU/L (ref 0.40–4.50)

## 2021-10-21 ENCOUNTER — Encounter: Payer: Self-pay | Admitting: Internal Medicine

## 2021-10-21 ENCOUNTER — Other Ambulatory Visit: Payer: Self-pay

## 2021-10-21 ENCOUNTER — Ambulatory Visit (INDEPENDENT_AMBULATORY_CARE_PROVIDER_SITE_OTHER): Payer: Medicare Other | Admitting: Internal Medicine

## 2021-10-21 VITALS — BP 128/88 | HR 74 | Temp 97.3°F | Resp 98 | Ht 70.25 in | Wt 176.0 lb

## 2021-10-21 DIAGNOSIS — Z0000 Encounter for general adult medical examination without abnormal findings: Secondary | ICD-10-CM | POA: Diagnosis not present

## 2021-10-21 DIAGNOSIS — Z7184 Encounter for health counseling related to travel: Secondary | ICD-10-CM | POA: Diagnosis not present

## 2021-10-21 DIAGNOSIS — Z91199 Patient's noncompliance with other medical treatment and regimen due to unspecified reason: Secondary | ICD-10-CM

## 2021-10-21 DIAGNOSIS — Z1211 Encounter for screening for malignant neoplasm of colon: Secondary | ICD-10-CM

## 2021-10-21 DIAGNOSIS — Z853 Personal history of malignant neoplasm of breast: Secondary | ICD-10-CM

## 2021-10-21 DIAGNOSIS — Z87891 Personal history of nicotine dependence: Secondary | ICD-10-CM

## 2021-10-21 DIAGNOSIS — E7800 Pure hypercholesterolemia, unspecified: Secondary | ICD-10-CM | POA: Diagnosis not present

## 2021-10-21 LAB — POCT URINALYSIS DIPSTICK
Bilirubin, UA: NEGATIVE
Blood, UA: NEGATIVE
Glucose, UA: NEGATIVE
Ketones, UA: NEGATIVE
Leukocytes, UA: NEGATIVE
Nitrite, UA: NEGATIVE
Protein, UA: NEGATIVE
Spec Grav, UA: 1.01 (ref 1.010–1.025)
Urobilinogen, UA: 0.2 E.U./dL
pH, UA: 6.5 (ref 5.0–8.0)

## 2021-10-21 NOTE — Progress Notes (Signed)
Annual Wellness Visit     Patient: Jamie Gilbert, Female    DOB: 1956-04-09, 66 y.o.   MRN: [id number] Visit Date: 10/21/2021  Chief Complaint  Patient presents with   Medicare Wellness   Subjective    Jamie Gilbert is a 66 y.o. A who presents today for her Annual Wellness Visit.  HPI Here for health maintenance exam and evaluation of medical issues.  Her daughter was diagnosed a few months ago with cancer and she has been spending a lot of time going with her to doctors appointments etc.  Unfortunately she has not been compliant with her statin medication and her fasting labs reflect that.  Total cholesterol is 330 and LDL cholesterol 244.  HDL is 65 and triglycerides are 91.  In 2021, Crestor was increased from 10 to 20 mg daily.  I have advised her to restart this medication immediately and follow-up with repeat lipid panel and liver functions in March.  She has a busy schedule.  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  History of smoking.  History of left breast ductal carcinoma in situ followed 2017.  2 years ago her vitamin D level was 10.  She was prescribed high-dose vitamin D and a year ago her level was 40.  I do not think she is taking high-dose vitamin D at the present time.  She last had bone density study in 2020 and lowest T score at the time was -2.0 consistent with osteopenia.  Reminded about colonoscopy.  Social history: She is married.  Social alcohol consumption.  2 adult daughters.  Husband is retired from [employer] business.  They have been remodeling their home.  This is her second marriage.  Family history: Mother died with abdominal aortic aneurysm around age 71.  She had a history of hypertension and was a former smoker.  Father died at age 78 with a brain tumor.  3 brothers.  No sisters.  History of right middle lobe pneumonia January 2000.  Infectious colitis February 2000.  Ectopic pregnancy and bilateral  tubal ligation 1990.  Torn medial meniscus of left knee with plica being excised 3875 and subsequently developing regional pain syndrome postsurgery.  She subsequently improved.  History of abnormal mammogram 2011 with benign breast biopsy.  In 2011 she was started on Zocor but has not been compliant.  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  She had lumpectomy January 2018 and completed adjuvant radiation therapy.  Antiestrogen therapy was recommended.  She did poorly with anastrozole so she was switched to exemestane but was not motivated to stay on it.  Has been followed by Dr. Jana Hakim but he retired recently.   Review of systems: History of musculoskeletal pain treated with ibuprofen as needed.  Also given Zofran today to take if needed with foreign travel.  Was given prescription for Flagyl 500 mg twice daily for 7 days if develops gastroenteritis with foreign travel.  Also was given Cipro 500 mg twice daily for 7 days if needed for UTI or gastroenteritis associated with foreign travel.      Social History   Social History Narrative   Social history: She is married.  Social alcohol consumption.  2 adult daughters.  Husband is retired from [employer] business.  They have been remodeling their home for several months during the pandemic and living in their basement which has been stressful.  This is her second marriage.  Family history: Mother died with abdominal aortic aneurysm around age 93.  She had a history of hypertension was a former smoker.  Father died at age 76 with a brain tumor.  3 brothers in their 1s in good health with exception of hyperlipidemia.  No sisters.    Patient Care Team: Elby Showers, MD as PCP - General (Internal Medicine) Magrinat, Virgie Dad, MD (Inactive) as Consulting Physician (Oncology) [name], MD as Consulting Physician (General Surgery) [name], MD as Consulting Physician (Radiation  Oncology)  Review of Systems see above-foreign travel antibiotic needed discussed.  Ibuprofen refilled at patient request for musculoskeletal pain.   Objective    Vitals: BP 128/88    Pulse 74    Temp (!) 97.3 F (36.3 C) (Tympanic)    Resp (!) 98    Ht 5' 10.25" (1.784 m)    Wt 176 lb (79.8 kg)    BMI 25.07 kg/m   Physical Exam  Skin: Warm and dry.  No cervical adenopathy.  No thyromegaly.  No carotid bruits.  Chest is clear to auscultation.  Breasts are without masses.  Cardiac exam regular rate and rhythm without murmur or ectopy.  Abdomen is soft nondistended without hepatosplenomegaly masses or tenderness.  No lower extremity pitting edema.  Neuro is intact without gross focal deficits.  Affect thought and judgment are normal.  Bimanual is normal.  Had Pap smear in 2021.   Most recent functional status assessment: In your present state of health, do you have any difficulty performing the following activities: 10/21/2021  Hearing? N  Vision? N  Difficulty concentrating or making decisions? N  Walking or climbing stairs? N  Dressing or bathing? N  Doing errands, shopping? N  Preparing Food and eating ? N  Using the Toilet? N  In the past six months, have you accidently leaked urine? N  Do you have problems with loss of bowel control? N  Managing your Medications? N  Managing your Finances? N  Housekeeping or managing your Housekeeping? N  Some recent data might be hidden   Most recent fall risk assessment: Fall Risk  10/21/2021  Falls in the past year? 0  Number falls in past yr: 0  Injury with Fall? 0  Risk for fall due to : No Fall Risks  Follow up Falls evaluation completed    Most recent depression screenings: PHQ 2/9 Scores 10/21/2021 12/23/2019  PHQ - 2 Score 0 0   Most recent cognitive screening: 6CIT Screen 10/21/2021  What Year? 0 points  What month? 0 points  What time? 0 points  Count back from 20 0 points  Months in reverse 0 points  Repeat phrase 0 points   Total Score 0       Assessment & Plan     Annual wellness visit done today including the all of the following: Reviewed patient's Family Medical History Reviewed and updated list of patient's medical providers Assessment of cognitive impairment was done Assessed patient's functional ability Established a written schedule for health screening [place] Completed and Reviewed  Discussed health benefits of physical activity, and encouraged her to engage in regular exercise appropriate for her age and condition.    History of breast cancer followed by Oncology  Need for antibiotics before travel discussed and prescriptions provided  Hyperlipidemia-needs to start Crestor once again and follow-up in 3 months  Needs colonoscopy  Have recommended COVID booster.  Our records indicate only 2 COVID vaccines in 2021.  Needs to consider tetanus booster as well since it was last given in 2011.  Needs to consider Shingrix vaccine and pneumococcal 20.     {I, Elby Showers, MD, have reviewed all documentation for this visit. The documentation on 11/06/21 for the exam, diagnosis, procedures, and orders are all accurate and complete.  All   Angus Seller, CMA

## 2021-10-24 ENCOUNTER — Telehealth: Payer: Self-pay | Admitting: Internal Medicine

## 2021-10-24 MED ORDER — ROSUVASTATIN CALCIUM 20 MG PO TABS: ORAL_TABLET | ORAL | 3 refills | Status: DC

## 2021-10-24 NOTE — Telephone Encounter (Signed)
Refilled,

## 2021-10-24 NOTE — Telephone Encounter (Signed)
Angeleen called to say that below pharmacy has not received her refill on below medication yet.  rosuvastatin (CRESTOR) 20 MG tablet  [place], [place] Phone:  [phone]  Fax:  [phone]

## 2021-11-06 NOTE — Patient Instructions (Addendum)
Please get back on cholesterol-lowering medication and follow-up in late March.  Medications prescribed for foreign travel.  Needs colonoscopy.  Consider  up-dating immunizations such as tetanus and COVID-vaccine.  Also needs pneumococcal 20 vaccine.  Consider Shingrix vaccine.  Needs annual mammogram.

## 2021-12-12 ENCOUNTER — Other Ambulatory Visit: Payer: Medicare Other

## 2021-12-12 ENCOUNTER — Other Ambulatory Visit: Payer: Self-pay

## 2021-12-12 DIAGNOSIS — E785 Hyperlipidemia, unspecified: Secondary | ICD-10-CM

## 2021-12-12 LAB — HEPATIC FUNCTION PANEL
AG Ratio: 2 (calc) (ref 1.0–2.5)
ALT: 11 U/L (ref 6–29)
AST: 17 U/L (ref 10–35)
Albumin: 4.7 g/dL (ref 3.6–5.1)
Alkaline phosphatase (APISO): 63 U/L (ref 37–153)
Bilirubin, Direct: 0.1 mg/dL (ref 0.0–0.2)
Globulin: 2.4 g/dL (calc) (ref 1.9–3.7)
Indirect Bilirubin: 0.4 mg/dL (calc) (ref 0.2–1.2)
Total Bilirubin: 0.5 mg/dL (ref 0.2–1.2)
Total Protein: 7.1 g/dL (ref 6.1–8.1)

## 2021-12-12 LAB — LIPID PANEL
Cholesterol: 242 mg/dL — ABNORMAL HIGH (ref <200)
HDL: 70 mg/dL (ref >=50)
LDL Cholesterol (Calc): 153 mg/dL (calc) — ABNORMAL HIGH
Non-HDL Cholesterol (Calc): 172 mg/dL (calc) — ABNORMAL HIGH (ref <130)
Total CHOL/HDL Ratio: 3.5 (calc) (ref <5.0)
Triglycerides: 88 mg/dL (ref <150)

## 2021-12-16 ENCOUNTER — Ambulatory Visit (INDEPENDENT_AMBULATORY_CARE_PROVIDER_SITE_OTHER): Payer: Medicare Other | Admitting: Internal Medicine

## 2021-12-16 ENCOUNTER — Encounter: Payer: Self-pay | Admitting: Internal Medicine

## 2021-12-16 VITALS — BP 124/76 | HR 78 | Temp 98.0°F | Ht 70.25 in | Wt 176.5 lb

## 2021-12-16 DIAGNOSIS — F439 Reaction to severe stress, unspecified: Secondary | ICD-10-CM

## 2021-12-16 DIAGNOSIS — R072 Precordial pain: Secondary | ICD-10-CM

## 2021-12-16 DIAGNOSIS — Z7184 Encounter for health counseling related to travel: Secondary | ICD-10-CM | POA: Diagnosis not present

## 2021-12-16 DIAGNOSIS — Z87891 Personal history of nicotine dependence: Secondary | ICD-10-CM

## 2021-12-16 DIAGNOSIS — M7918 Myalgia, other site: Secondary | ICD-10-CM

## 2021-12-16 DIAGNOSIS — E7800 Pure hypercholesterolemia, unspecified: Secondary | ICD-10-CM | POA: Diagnosis not present

## 2021-12-16 DIAGNOSIS — Z853 Personal history of malignant neoplasm of breast: Secondary | ICD-10-CM | POA: Diagnosis not present

## 2021-12-16 MED ORDER — IBUPROFEN 800 MG PO TABS: 800.0000 mg | ORAL_TABLET | Freq: Three times a day (TID) | ORAL | 0 refills | Status: DC | PRN

## 2021-12-16 MED ORDER — ONDANSETRON HCL 4 MG PO TABS: 4.0000 mg | ORAL_TABLET | Freq: Three times a day (TID) | ORAL | 0 refills | Status: DC | PRN

## 2021-12-16 MED ORDER — METRONIDAZOLE 500 MG PO TABS: 500.0000 mg | ORAL_TABLET | Freq: Two times a day (BID) | ORAL | 0 refills | Status: AC

## 2021-12-16 NOTE — Progress Notes (Signed)
? ?  Subjective:  ? ? Patient ID: Jamie Gilbert, female    DOB: 08-08-1956, 66 y.o.   MRN: [id number] ? ?HPI She is here to follow-up on hyperlipidemia.  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  As of February she is on Crestor 20 mg daily.  Total cholesterol improved from 3 38-2 42.  Triglycerides are normal.  LDL improved from 2 44-1 53.  I mentioned increasing Crestor to 40 mg daily and she does not want to do that.  Says she will watch her diet while she is gone.  Liver functions are normal. ? ?There is situational stress with her daughter who has history of cancer. ? ?Since she will be traveling, I am prescribing Zofran if needed for nausea. ? ?Due to hyperlipidemia, I suggested a coronary calcium score.  This can be done in the summer when she returns from [place]. ? ? ? ?Review of Systems see above ? ?   ?Objective:  ? Physical Exam ?Blood pressure 124/76 pulse 78 temperature 98 degrees pulse oximetry 96% weight 176 pounds 8 ounces BMI 25.15 chest is clear to auscultation.  Cardiac exam regular rate and rhythm without murmurs or ectopy.  No lower extremity edema. ? ? ? ?   ?Assessment & Plan:  ?Hyperlipidemia-being referred for coronary calcium score this Summer when she returns from [place].  Lipids have improved after she restarted statin medication. ? ?Situational stress with her daughter ? ?Patient has history of breast cancer ? ?Travel advice-prescribed Zofran for travel as requested.  Advil refilled for musculoskeletal pain. ? ?Welcome to Medicare exam was performed February 2023.  She may return in February 2024 for another exam. ? ?

## 2022-01-08 ENCOUNTER — Encounter: Payer: Self-pay | Admitting: Internal Medicine

## 2022-01-08 NOTE — Patient Instructions (Addendum)
Zofran prescribed for travel to [place].  Have coronary calcium score this summer.  Medicare wellness visit due February 2024.  Continue statin medication.  Advil refilled. ?

## 2022-03-29 ENCOUNTER — Ambulatory Visit (HOSPITAL_COMMUNITY)
Admission: RE | Admit: 2022-03-29 | Discharge: 2022-03-29 | Disposition: A | Discharge status: Home / Self Care | Payer: Medicare Other | Source: Ambulatory Visit | Attending: Internal Medicine | Admitting: Internal Medicine

## 2022-03-29 DIAGNOSIS — F439 Reaction to severe stress, unspecified: Secondary | ICD-10-CM | POA: Insufficient documentation

## 2022-05-30 ENCOUNTER — Other Ambulatory Visit: Payer: Medicare Other

## 2022-05-30 DIAGNOSIS — E7800 Pure hypercholesterolemia, unspecified: Secondary | ICD-10-CM

## 2022-05-30 DIAGNOSIS — F439 Reaction to severe stress, unspecified: Secondary | ICD-10-CM

## 2022-05-31 LAB — LIPID PANEL
Cholesterol: 237 mg/dL — ABNORMAL HIGH (ref <200)
HDL: 77 mg/dL (ref >=50)
LDL Cholesterol (Calc): 141 mg/dL (calc) — ABNORMAL HIGH
Non-HDL Cholesterol (Calc): 160 mg/dL (calc) — ABNORMAL HIGH (ref <130)
Total CHOL/HDL Ratio: 3.1 (calc) (ref <5.0)
Triglycerides: 82 mg/dL (ref <150)

## 2022-05-31 LAB — HEPATIC FUNCTION PANEL
AG Ratio: 1.8 (calc) (ref 1.0–2.5)
ALT: 17 U/L (ref 6–29)
AST: 20 U/L (ref 10–35)
Albumin: 4.8 g/dL (ref 3.6–5.1)
Alkaline phosphatase (APISO): 72 U/L (ref 37–153)
Bilirubin, Direct: 0.1 mg/dL (ref 0.0–0.2)
Globulin: 2.6 g/dL (calc) (ref 1.9–3.7)
Indirect Bilirubin: 0.3 mg/dL (calc) (ref 0.2–1.2)
Total Bilirubin: 0.4 mg/dL (ref 0.2–1.2)
Total Protein: 7.4 g/dL (ref 6.1–8.1)

## 2022-06-05 ENCOUNTER — Ambulatory Visit (INDEPENDENT_AMBULATORY_CARE_PROVIDER_SITE_OTHER): Payer: Medicare Other | Admitting: Internal Medicine

## 2022-06-05 ENCOUNTER — Encounter: Payer: Self-pay | Admitting: Internal Medicine

## 2022-06-05 VITALS — BP 120/74 | HR 74 | Temp 97.4°F | Ht 70.25 in | Wt 177.8 lb

## 2022-06-05 DIAGNOSIS — Z87891 Personal history of nicotine dependence: Secondary | ICD-10-CM

## 2022-06-05 DIAGNOSIS — Z853 Personal history of malignant neoplasm of breast: Secondary | ICD-10-CM | POA: Diagnosis not present

## 2022-06-05 DIAGNOSIS — E7800 Pure hypercholesterolemia, unspecified: Secondary | ICD-10-CM | POA: Diagnosis not present

## 2022-06-05 MED ORDER — ROSUVASTATIN CALCIUM 40 MG PO TABS: 40.0000 mg | ORAL_TABLET | Freq: Every day | ORAL | 3 refills | Status: DC

## 2022-06-05 NOTE — Progress Notes (Signed)
   Subjective:    Patient ID: Jamie Gilbert, female    DOB: 1955-10-06, 66 y.o.   MRN: [id number]  HPI  66 year old Female seen for follow-up on hyperlipidemia.  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  She remains on Crestor 20 mg daily.  Says she has been fairly compliant with it.  However, it does not seem to be completely controlling her hyperlipidemia.  Total cholesterol was 242 in March and is now 237.  LDL was 153 in March and is now 141.  Her HDL is good at 77 and her triglycerides are low at 82 currently.  I would like for her to increase Crestor to 40 mg daily.  She is a bit reluctant but agrees.  Have also ordered coronary calcium scoring.  Her total score was 224 which would make it reasonable for her to be on statin therapy.  Her CT of the lungs was negative.  She was glad to hear about that.  Remote history of left breast cancer in 2017.  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  She had lumpectomy in January 2018 and had adjuvant radiation therapy.  Antiestrogen therapy was recommended.  She did poorly with anastrozole and was switched to exemestane but was not motivated to stay on it-needs to keep weight under control.  History of vitamin D deficiency. Immunizations discussed.   Needs to have colonoscopy  Review of Systems see above     Objective:   Physical Exam Signs reviewed.  Blood pressure 120/74 weight 177 pounds 12.8 ounces BMI 25.33 She is clear.  Cardiac exam: Regular rate and rhythm without ectopy or murmur.  No lower extremity pitting edema.      Assessment & Plan:  Hyperlipidemia-increase statin to Crestor 40 mg daily and follow-up in December with lipid panel liver functions and office visit.  Coronary calcium scoring reviewed with patient.  Vaccines discussed.  Patient is reluctant to take vaccines at this point in time.  Situational stress with daughter who has had medical issues  History of smoking-CT  was negative for lung cancer  Plan: She will return in 3 months for follow-up with lipid panel liver functions and office visit regarding hyperlipidemia.

## 2022-06-05 NOTE — Patient Instructions (Signed)
Increase Crestor generic to 40 mg daily. RTC in 3 months. Vaccines discussed Pt declines at this time.

## 2022-08-31 ENCOUNTER — Other Ambulatory Visit: Payer: Medicare Other

## 2022-09-04 ENCOUNTER — Ambulatory Visit: Payer: Medicare Other | Admitting: Internal Medicine

## 2022-11-21 NOTE — Progress Notes (Signed)
Annual Wellness Visit    Patient Care Team: Shaquera Ansley, Cresenciano Lick, MD as PCP - General (Internal Medicine) Magrinat, Virgie Dad, MD (Inactive) as Consulting Physician (Oncology) [name], MD as Consulting Physician (General Surgery) [name], MD as Consulting Physician (Radiation Oncology)  Visit Date: 11/28/22   Chief Complaint  Patient presents with   Medicare Wellness   Annual Exam    Subjective:   Patient: Jamie Gilbert, Female    DOB: [birth year], 67 y.o.   MRN: [id number]  Jamie Gilbert is a 67 y.o. Female who presents today for her Annual Wellness Visit. She has a history of breast cancer, hyperlipidemia, Vitamin D deficiency.  History of Vitamin D deficiency treated with Drisdol 50,000 units once weekly.  History of hyperlipidemia treated with Crestor 40 mg daily. CHOL elevated at 349, LDL at 259 on 11/27/22. No longer tolerates statins.  Situational stress discussed.  Mammogram last completed 08/09/21. Stable left breast lumpectomy site. No mammographic evidence of malignancy in bilateral breasts. Recommended repeat in 2024.   Reminded about colonoscopy. She would like to see Dr. Collene Mares for this.  DEXA scan last completed 12/02/18. BMD measured at Femur Neck Left is 0.764 g/cm2 with a T-score of -2.0. This patient is considered osteopenic.   Coronary calcium score at 224 on 03/29/22. 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  Social alcohol consumption.   2 adult daughters.  Husband is retired from [employer] business.  They have been remodeling their home for several months during the pandemic and living in their basement which has been stressful.  This is her second marriage.       Family history: Mother died with abdominal aortic aneurysm around age 31.  She had a history of hypertension was a former smoker.  Father died at age 39 with a brain tumor.  3 brothers in their 68s in good health with exception of hyperlipidemia.  No sisters.     Review of Systems  Constitutional:  Negative for chills, fever, malaise/fatigue and weight loss.  HENT:  Negative for hearing loss, sinus pain and sore throat.   Respiratory:  Negative for cough and hemoptysis.   Cardiovascular:  Negative for chest pain, palpitations, leg swelling and PND.  Gastrointestinal:  Negative for abdominal pain, constipation, diarrhea, heartburn, nausea and vomiting.  Genitourinary:  Negative for dysuria, frequency and urgency.  Musculoskeletal:  Negative for back pain, myalgias and neck pain.  Skin:  Negative for itching and rash.  Neurological:  Negative for dizziness, tingling, seizures and headaches.  Endo/Heme/Allergies:  Negative for polydipsia.  Psychiatric/Behavioral:  Negative for depression. The patient is not nervous/anxious.       Objective:   Vitals: BP 124/80   Pulse 76   Temp 98.4 F (36.9 C) (Tympanic)   Ht 5\' 10"  (1.778 m)   Wt 177 lb 6.4 oz (80.5 kg)  SpO2 98%   BMI 25.45 kg/m   Physical Exam Vitals and nursing note reviewed.  Constitutional:      General: She is not in acute distress.    Appearance: Normal appearance. She is not ill-appearing or toxic-appearing.  HENT:     Head: Normocephalic and atraumatic.     Right Ear: Hearing, tympanic membrane, ear canal and external ear normal.     Left Ear: Hearing, tympanic membrane, ear canal and external ear normal.     Mouth/Throat:     Pharynx: Oropharynx is clear.  Eyes:      Extraocular Movements: Extraocular movements intact.     Pupils: Pupils are equal, round, and reactive to light.  Neck:     Thyroid: No thyroid mass, thyromegaly or thyroid tenderness.     Vascular: No carotid bruit.  Cardiovascular:     Rate and Rhythm: Normal rate and regular rhythm. No extrasystoles are present.    Pulses: Normal pulses.          Dorsalis pedis pulses are 2+ on the right side and 2+ on the left side.       Posterior tibial pulses are 2+ on the right side and 2+ on the left side.     Heart sounds: Normal heart sounds. No murmur heard.    No friction rub. No gallop.  Pulmonary:     Effort: Pulmonary effort is normal.     Breath sounds: Normal breath sounds. No decreased breath sounds, wheezing, rhonchi or rales.  Chest:     Chest wall: No mass.  Abdominal:     Palpations: Abdomen is soft. There is no hepatomegaly, splenomegaly or mass.     Tenderness: There is no abdominal tenderness.     Hernia: No hernia is present.  Musculoskeletal:     Cervical back: Normal range of motion.     Right lower leg: No edema.     Left lower leg: No edema.  Lymphadenopathy:     Cervical: No cervical adenopathy.     Upper Body:     Right upper body: No supraclavicular adenopathy.     Left upper body: No supraclavicular adenopathy.  Skin:    General: Skin is warm and dry.  Neurological:     General: No focal deficit present.     Mental Status: She is alert and oriented to person, place, and time. Mental status is at baseline.     Sensory: Sensation is intact.     Motor: Motor function is intact. No weakness.     Deep Tendon Reflexes: Reflexes are normal and symmetric.  Psychiatric:        Attention and Perception: Attention normal.        Mood and Affect: Mood normal.        Speech: Speech normal.        Behavior: Behavior normal.        Thought Content: Thought content normal.        Cognition and Memory: Cognition normal.        Judgment: Judgment normal.      Most  recent functional status assessment:    11/28/2022   11:14 AM  In your present state of health, do you have any difficulty performing the following activities:  Hearing? 0  Vision? 0  Difficulty concentrating or making decisions? 0  Walking or climbing stairs? 0  Dressing or bathing? 0  Doing errands, shopping? 0  Preparing Food and eating ? N  Using the Toilet?  N  In the past six months, have you accidently leaked urine? N  Do you have problems with loss of bowel control? N  Managing your Medications? N  Managing your Finances? N  Housekeeping or managing your Housekeeping? N   Most recent fall risk assessment:    11/28/2022   11:13 AM  Fall Risk   Falls in the past year? 0  Number falls in past yr: 0  Injury with Fall? 0  Risk for fall due to : No Fall Risks  Follow up Falls prevention discussed    Most recent depression screenings:    11/28/2022   11:13 AM 06/05/2022   11:15 AM  PHQ 2/9 Scores  PHQ - 2 Score 0 0   Most recent cognitive screening:    11/28/2022   11:14 AM  6CIT Screen  What time? 0 points  Count back from 20 0 points  Months in reverse 0 points  Repeat phrase 0 points     Results:   Studies obtained and personally reviewed by me:  Mammogram last completed 08/09/21. Stable left breast lumpectomy site. No mammographic evidence of malignancy in bilateral breasts. Recommended repeat in 2024.   DEXA scan last completed 12/02/18. BMD measured at Femur Neck Left is 0.764 g/cm2 with a T-score of -2.0. This patient is considered osteopenic.    Labs:       Component Value Date/Time   NA 140 11/27/2022 0957   NA 140 07/25/2017 0917   K 5.0 11/27/2022 0957   K 3.7 07/25/2017 0917   CL 104 11/27/2022 0957   CO2 28 11/27/2022 0957   CO2 27 07/25/2017 0917   GLUCOSE 108 (H) 11/27/2022 0957   GLUCOSE 126 07/25/2017 0917   BUN 11 11/27/2022 0957   BUN 10.3 07/25/2017 0917   CREATININE 0.77 11/27/2022 0957   CREATININE 0.8 07/25/2017 0917    CALCIUM 9.7 11/27/2022 0957   CALCIUM 9.3 07/25/2017 0917   PROT 6.9 11/27/2022 0957   PROT 7.3 07/25/2017 0917   ALBUMIN 4.0 07/25/2017 0917   AST 14 11/27/2022 0957   AST 17 07/25/2017 0917   ALT 9 11/27/2022 0957   ALT 14 07/25/2017 0917   ALKPHOS 56 07/25/2017 0917   BILITOT 0.5 11/27/2022 0957   BILITOT 0.46 07/25/2017 0917   GFRNONAA 84 10/13/2019 1134   GFRAA 98 10/13/2019 1134     Lab Results  Component Value Date   WBC 5.0 11/27/2022   HGB 13.6 11/27/2022   HCT 40.1 11/27/2022   MCV 93.5 11/27/2022   PLT 381 11/27/2022    Lab Results  Component Value Date   CHOL 349 (H) 11/27/2022   HDL 66 11/27/2022   LDLCALC 259 (H) 11/27/2022   TRIG 106 11/27/2022   CHOLHDL 5.3 (H) 11/27/2022    Lab Results  Component Value Date   HGBA1C 5.7 (H) 11/22/2015     Lab Results  Component Value Date   TSH 1.99 11/27/2022    Assessment & Plan:   Vitamin D deficiency: treated with Drisdol 50,000 units once weekly.  Hyperlipidemia: treated with Crestor 40 mg daily. CHOL elevated at 349, LDL at 259 on 11/27/22. No longer tolerates statins. Call [place] after traveling for lipid clinic referral.   Mammogram: last completed 08/09/21. Stable left breast lumpectomy site. No mammographic evidence of malignancy in bilateral breasts. Recommended repeat in 2024. Mammogram ordered.  Smoking- needs to quit.  DEXA scan: last completed 12/02/18. BMD measured at Femur Neck Left is 0.764 g/cm2 with  a T-score of -2.0. This patient is considered osteopenic.   Ordered low-dose lung cancer CT screening.  Prescribed Levaquin 7 days once daily and Zofran for upcoming travel.  Vaccine Counseling: Will go to pharmacy for tetanus vaccine.     Annual wellness visit done today including the all of the following: Reviewed patient's Family Medical History Reviewed and updated list of patient's medical providers Assessment of cognitive impairment was done Assessed patient's functional  ability Established a written schedule for health screening [place] Completed and Reviewed  Discussed health benefits of physical activity, and encouraged her to engage in regular exercise appropriate for her age and condition.        I,Alexander Ruley,acting as a Education administrator for Elby Showers, MD.,have documented all relevant documentation on the behalf of Elby Showers, MD,as directed by  Elby Showers, MD while in the presence of Elby Showers, MD.   I, Elby Showers, MD, have reviewed all documentation for this visit. The documentation on 12/01/22 for the exam, diagnosis, procedures, and orders are all accurate and complete.

## 2022-11-27 ENCOUNTER — Other Ambulatory Visit: Payer: Medicare Other

## 2022-11-27 DIAGNOSIS — R5383 Other fatigue: Secondary | ICD-10-CM

## 2022-11-27 DIAGNOSIS — F439 Reaction to severe stress, unspecified: Secondary | ICD-10-CM

## 2022-11-27 DIAGNOSIS — E7800 Pure hypercholesterolemia, unspecified: Secondary | ICD-10-CM

## 2022-11-28 ENCOUNTER — Ambulatory Visit (INDEPENDENT_AMBULATORY_CARE_PROVIDER_SITE_OTHER): Payer: Medicare Other | Admitting: Internal Medicine

## 2022-11-28 ENCOUNTER — Encounter: Payer: Self-pay | Admitting: Internal Medicine

## 2022-11-28 VITALS — BP 124/80 | HR 76 | Temp 98.4°F | Ht 70.0 in | Wt 177.4 lb

## 2022-11-28 DIAGNOSIS — F439 Reaction to severe stress, unspecified: Secondary | ICD-10-CM

## 2022-11-28 DIAGNOSIS — D0512 Intraductal carcinoma in situ of left breast: Secondary | ICD-10-CM

## 2022-11-28 DIAGNOSIS — R82998 Other abnormal findings in urine: Secondary | ICD-10-CM | POA: Diagnosis not present

## 2022-11-28 DIAGNOSIS — Z7184 Encounter for health counseling related to travel: Secondary | ICD-10-CM

## 2022-11-28 DIAGNOSIS — Z1231 Encounter for screening mammogram for malignant neoplasm of breast: Secondary | ICD-10-CM

## 2022-11-28 DIAGNOSIS — Z853 Personal history of malignant neoplasm of breast: Secondary | ICD-10-CM

## 2022-11-28 DIAGNOSIS — Z789 Other specified health status: Secondary | ICD-10-CM

## 2022-11-28 DIAGNOSIS — E559 Vitamin D deficiency, unspecified: Secondary | ICD-10-CM

## 2022-11-28 DIAGNOSIS — Z87891 Personal history of nicotine dependence: Secondary | ICD-10-CM

## 2022-11-28 DIAGNOSIS — E7800 Pure hypercholesterolemia, unspecified: Secondary | ICD-10-CM | POA: Diagnosis not present

## 2022-11-28 DIAGNOSIS — Z0000 Encounter for general adult medical examination without abnormal findings: Secondary | ICD-10-CM | POA: Diagnosis not present

## 2022-11-28 LAB — POCT URINALYSIS DIPSTICK
Bilirubin, UA: NEGATIVE
Blood, UA: NEGATIVE
Glucose, UA: NEGATIVE
Ketones, UA: NEGATIVE
Nitrite, UA: NEGATIVE
Protein, UA: NEGATIVE
Spec Grav, UA: <=1.005 (ref 1.010–1.025)
Urobilinogen, UA: 0.2 E.U./dL
pH, UA: 5 (ref 5.0–8.0)

## 2022-11-28 LAB — CBC WITH DIFFERENTIAL/PLATELET
Absolute Monocytes: 360 cells/uL (ref 200–950)
Basophils Absolute: 40 cells/uL (ref 0–200)
Basophils Relative: 0.8 %
Eosinophils Absolute: 20 cells/uL (ref 15–500)
Eosinophils Relative: 0.4 %
HCT: 40.1 % (ref 35.0–45.0)
Hemoglobin: 13.6 g/dL (ref 11.7–15.5)
Lymphs Abs: 1310 cells/uL (ref 850–3900)
MCH: 31.7 pg (ref 27.0–33.0)
MCHC: 33.9 g/dL (ref 32.0–36.0)
MCV: 93.5 fL (ref 80.0–100.0)
MPV: 9.2 fL (ref 7.5–12.5)
Monocytes Relative: 7.2 %
Neutro Abs: 3270 cells/uL (ref 1500–7800)
Neutrophils Relative %: 65.4 %
Platelets: 381 10*3/uL (ref 140–400)
RBC: 4.29 10*6/uL (ref 3.80–5.10)
RDW: 12.6 % (ref 11.0–15.0)
Total Lymphocyte: 26.2 %
WBC: 5 10*3/uL (ref 3.8–10.8)

## 2022-11-28 LAB — COMPLETE METABOLIC PANEL WITH GFR
AG Ratio: 1.9 (calc) (ref 1.0–2.5)
ALT: 9 U/L (ref 6–29)
AST: 14 U/L (ref 10–35)
Albumin: 4.5 g/dL (ref 3.6–5.1)
Alkaline phosphatase (APISO): 58 U/L (ref 37–153)
BUN: 11 mg/dL (ref 7–25)
CO2: 28 mmol/L (ref 20–32)
Calcium: 9.7 mg/dL (ref 8.6–10.4)
Chloride: 104 mmol/L (ref 98–110)
Creat: 0.77 mg/dL (ref 0.50–1.05)
Globulin: 2.4 g/dL (calc) (ref 1.9–3.7)
Glucose, Bld: 108 mg/dL — ABNORMAL HIGH (ref 65–99)
Potassium: 5 mmol/L (ref 3.5–5.3)
Sodium: 140 mmol/L (ref 135–146)
Total Bilirubin: 0.5 mg/dL (ref 0.2–1.2)
Total Protein: 6.9 g/dL (ref 6.1–8.1)
eGFR: 84 mL/min/{1.73_m2} (ref >=60)

## 2022-11-28 LAB — LIPID PANEL
Cholesterol: 349 mg/dL — ABNORMAL HIGH (ref <200)
HDL: 66 mg/dL (ref >=50)
LDL Cholesterol (Calc): 259 mg/dL (calc) — ABNORMAL HIGH
Non-HDL Cholesterol (Calc): 283 mg/dL (calc) — ABNORMAL HIGH (ref <130)
Total CHOL/HDL Ratio: 5.3 (calc) — ABNORMAL HIGH (ref <5.0)
Triglycerides: 106 mg/dL (ref <150)

## 2022-11-28 LAB — TSH: TSH: 1.99 mIU/L (ref 0.40–4.50)

## 2022-11-28 MED ORDER — LEVOFLOXACIN 500 MG PO TABS: 500.0000 mg | ORAL_TABLET | Freq: Every day | ORAL | 0 refills | Status: AC

## 2022-11-28 MED ORDER — ERGOCALCIFEROL 1.25 MG (50000 UT) PO CAPS: 50000.0000 [IU] | ORAL_CAPSULE | ORAL | 3 refills | Status: AC

## 2022-11-28 MED ORDER — ONDANSETRON HCL 4 MG PO TABS: 4.0000 mg | ORAL_TABLET | Freq: Three times a day (TID) | ORAL | 0 refills | Status: AC | PRN

## 2022-11-29 LAB — URINE CULTURE
MICRO NUMBER:: 14681120
SPECIMEN QUALITY:: ADEQUATE

## 2022-12-01 DIAGNOSIS — Z789 Other specified health status: Secondary | ICD-10-CM | POA: Insufficient documentation

## 2022-12-01 NOTE — Patient Instructions (Signed)
Levaquin prescribed for upcoming international travel as well as Zofran. No longer tolerated statins. Needs referral to lipid clinic when returns from trip. She should call us for referral at that time. Take high dose Vitamin D weekly. Please quit smoking.

## 2023-01-05 ENCOUNTER — Ambulatory Visit
Admission: RE | Admit: 2023-01-05 | Discharge: 2023-01-05 | Disposition: A | Discharge status: Home / Self Care | Payer: Medicare Other | Source: Ambulatory Visit | Attending: Internal Medicine | Admitting: Internal Medicine

## 2023-01-17 NOTE — Progress Notes (Signed)
Left patient a voicemail asking to schedule mammogram.

## 2023-03-23 ENCOUNTER — Ambulatory Visit: Payer: Medicare Other | Attending: Internal Medicine | Admitting: Internal Medicine

## 2023-03-23 ENCOUNTER — Telehealth: Payer: Self-pay | Admitting: Internal Medicine

## 2023-03-23 ENCOUNTER — Encounter: Payer: Self-pay | Admitting: Internal Medicine

## 2023-03-23 VITALS — BP 128/74 | HR 92 | Ht 70.0 in | Wt 174.0 lb

## 2023-03-23 DIAGNOSIS — R931 Abnormal findings on diagnostic imaging of heart and coronary circulation: Secondary | ICD-10-CM | POA: Insufficient documentation

## 2023-03-23 DIAGNOSIS — I700 Atherosclerosis of aorta: Secondary | ICD-10-CM | POA: Diagnosis present

## 2023-03-23 DIAGNOSIS — T466X5A Adverse effect of antihyperlipidemic and antiarteriosclerotic drugs, initial encounter: Secondary | ICD-10-CM

## 2023-03-23 DIAGNOSIS — M791 Myalgia, unspecified site: Secondary | ICD-10-CM

## 2023-03-23 DIAGNOSIS — E7800 Pure hypercholesterolemia, unspecified: Secondary | ICD-10-CM | POA: Diagnosis present

## 2023-03-23 NOTE — Patient Instructions (Signed)
Medication Instructions:  Dr. Rennis Golden has recommended an injectable medication called LEQVIO. This is administered by a health care provider. The frequency of injections is TWO injections given 3 months apart (loading dose) and then every 6 months after that. The injection appointments at [facility] ([address], [address] [place], [place]  [address]). Once we have the benefits check information, we will reach out to let you know if the medication is covered 100%, if there is a deductible, co-insurance, out-of-pocket max. From there, we will see if you need patient assistance and our team will take care of working on this. Because of the frequency schedule of this medication, your follow up/repeat cholesterol lab work will be about 5-6 months from now.      Lab Work: FASTING lab work to check cholesterol in 5-6 months ** complete about 1 week before next appointment   If you have labs (blood work) drawn today and your tests are completely normal, you will receive your results only by: [employer] (if you have MyChart) OR A paper copy in the mail If you have any lab test that is abnormal or we need to change your treatment, we will call you to review the results.    Follow-Up: At [facility], you and your health needs are our priority.  As part of our continuing mission to provide you with exceptional heart care, we have created designated Provider Care Teams.  These Care Teams include your primary Cardiologist (physician) and Advanced Practice Providers (APPs -  Physician Assistants and Nurse Practitioners) who all work together to provide you with the care you need, when you need it.  We recommend signing up for the patient portal called "MyChart".  Sign up information is provided on this After Visit Summary.  MyChart is used to connect with patients for Virtual Visits (Telemedicine).  Patients are able to view lab/test results, encounter notes, upcoming  appointments, etc.  Non-urgent messages can be sent to your provider as well.   To learn more about what you can do with MyChart, go to [email or web address].    Your next appointment:    6 months with Dr. Rennis Golden  ** call in September/October for an appointment -- a reminder will also be sent in MyChart

## 2023-03-23 NOTE — Telephone Encounter (Signed)
Patient has been identified as candidate for Leqvio  Benefits investigation enrollment submitted to [employer] on 03/23/23 via fax

## 2023-03-23 NOTE — Progress Notes (Signed)
LIPID CLINIC CONSULT NOTE  Chief Complaint:  Manage dyslipidemia  Primary Care Physician: Margaree Mackintosh, MD  Primary Cardiologist:  None  HPI:  Jamie Gilbert is a 67 y.o. female who is being seen today for the evaluation of dyslipidemia at the request of Baxley, Luanna Cole, MD. this a pleasant 67 year old female kindly referred by Dr. Lenord Fellers for evaluation management of dyslipidemia.  She reports a longstanding history of high cholesterol and a family history of high cholesterol in her mother side.  No clear early onset heart disease.  Other than that she has a history of breast cancer now 10 years out in remission.  She has a longstanding smoking history which is 37 pack years and recently had a CT scan that was negative for cancer but showed emphysema, coronary calcification and aortic atherosclerosis.  She had a dedicated calcium score in 2023 which showed a calcium score of 224, 88th percentile for age and sex matched controls.  She has been on and off of statin therapy in the past, but reports intolerance to higher dose statins namely rosuvastatin and simvastatin.  Her most recent lipid showed total cholesterol 349, HDL 66, triglycerides 106 and LDL 259.  She reports her diet is less optimal does eat a lot of cheese.  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  She says she travels a lot.  She denies any chest pain or shortness of breath with exertion.  She is on vitamin D but otherwise no medications.  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   Hyperlipidemia    Personal history of radiation therapy    PONV (postoperative nausea and vomiting)    Vitamin D deficiency     Past Surgical History:  Procedure Laterality Date   BREAST BIOPSY     BREAST LUMPECTOMY Left    09/2016   BREAST LUMPECTOMY WITH RADIOACTIVE SEED LOCALIZATION Left 09/29/2016   Procedure: LEFT BREAST LUMPECTOMY WITH RADIOACTIVE SEED LOCALIZATION;  Surgeon: Ovidio Kin, MD;  Location: [facility] OR;  Service: General;  Laterality: Left;   ECTOPIC PREGNANCY SURGERY     medial meniscus repair     left knee w/plica excised   TUBAL LIGATION      FAMHx:  Family History  Problem Relation Age of Onset   Hypertension Mother    Aortic aneurysm Mother    Brain cancer Father    Prostate cancer Brother 70   Rectal cancer Daughter     SOCHx:   reports that she has been smoking cigarettes and e-cigarettes. She has a 37.50 pack-year smoking history. She uses smokeless tobacco. She reports current alcohol use of about 21.0 standard drinks of alcohol per week. She reports that she does not use drugs.  ALLERGIES:  Allergies  Allergen Reactions   Egg-Derived Products Nausea And Vomiting    ROS: Pertinent items noted in HPI and remainder of comprehensive ROS otherwise negative.  HOME MEDS: Current Outpatient Medications on File Prior to Visit  Medication Sig Dispense Refill   ergocalciferol (DRISDOL) 1.25 MG (50000 UT) capsule Take 1 capsule (50,000 Units total) by mouth once a week. 12 capsule 3   ibuprofen (ADVIL) 800 MG tablet Take 1 tablet (800 mg total) by mouth every 8 (eight) hours as needed. 90 tablet 0   ondansetron (ZOFRAN) 4 MG tablet Take 1 tablet (4 mg total) by mouth every 8 (eight) hours as needed for nausea or vomiting. 20  tablet 0   rosuvastatin (CRESTOR) 40 MG tablet Take 1 tablet (40 mg total) by mouth daily. (Patient not taking: Reported on 03/23/2023) 90 tablet 3   No current facility-administered medications on file prior to visit.    LABS/IMAGING: No results found for this or any previous visit (from the past 48 hour(s)). No results found.  LIPID PANEL:    Component Value Date/Time   CHOL 349 (H) 11/27/2022 0957   TRIG 106 11/27/2022 0957   HDL 66 11/27/2022 0957   CHOLHDL 5.3 (H) 11/27/2022 0957   VLDL 15 06/06/2016 1033   LDLCALC 259 (H) 11/27/2022 0957    WEIGHTS: Wt Readings from Last 3 Encounters:  03/23/23 174 lb (78.9  kg)  11/28/22 177 lb 6.4 oz (80.5 kg)  06/05/22 177 lb 12.8 oz (80.6 kg)    VITALS: BP 128/74 (BP Location: Left Arm, Patient Position: Sitting, Cuff Size: Normal)   Pulse 92   Ht 5\' 10"  (1.778 m)   Wt 174 lb (78.9 kg)   SpO2 98%   BMI 24.97 kg/m   EXAM: Deferred  EKG: Deferred  ASSESSMENT: Probable familial hyperlipidemia, LDL greater than 190, based on Simon Broome criteria Family history of first-degree relative with high cholesterol Statin intolerant-myalgias and brain fog CAC score of 224, 88th percentile (2023) 37-pack-year smoker, ongoing CT evidence of emphysema Aortic atherosclerosis  PLAN: 1.   Ms. Jamie Gilbert has probable familial hyperlipidemia with an LDL greater than 190 untreated although does have a higher fat diet.  We have discussed ways to try to improve her lipids with reducing saturated fats.  She will likely need additional medical therapy.  She has been intolerant to statins due to brain fog and issues with myalgias.  Based on that she is a good candidate for PCSK9 inhibitor.  Her insurance seems to be ideal for Leqvio and she is not interested in giving herself injections.  Will pursue this optio however she would likely need additional therapy to try to target her LDL down to 70 if possible.  Plan a repeat lipid in about 4 to 6 months after the 2 initial doses which are 3 months apart and follow-up with me afterwards.  Thanks as always for the kind referral.  Jamie Nose, MD, Milagros Loll  [place]  [facility]  Medical Director of the Advanced Lipid Disorders &  Cardiovascular Risk Reduction Clinic Diplomate of the American Board of Clinical Lipidology Attending Cardiologist  Direct Dial: [phone]  Fax: [phone]  Website:  [email or web address] [email or web address] 03/23/2023, 10:41 AM

## 2023-03-28 NOTE — Telephone Encounter (Signed)
Jamie Gilbert called in regarding Leqvio on behalf of [employer], requesting to speak with Jamie Gilbert, Charity fundraiser. She states they are missing information on the enrollment.  Phone#: [phone] Fax#: [phone]

## 2023-04-05 ENCOUNTER — Encounter: Payer: Self-pay | Admitting: Internal Medicine

## 2023-04-05 NOTE — Addendum Note (Signed)
Addended by: Lindell Spar on: 04/05/2023 02:35 PM   Modules accepted: Orders

## 2023-04-05 NOTE — Telephone Encounter (Signed)
Patient has been identified as candidate for Leqvio  Benefits investigation enrollment completed on 04/04/23  Benefits investigation report notes the following:  Type of insurance: medicare, [employer] part [place]  OOP Max: none / Met: n/a  Deductible: $240  Co-insurance: 20%  PA required: NO  PA phone number: n/a  Benefits Summary Details:   Primary:  Medicare Part B covers Leqvio at 80% after $240 deductible is met (met $122.19). PA not required.   Secondary: BCBS Supplement Plan G covers 20% coinsurance, but does not cover deductible. PA not required.

## 2023-04-05 NOTE — Telephone Encounter (Signed)
Patient referred to [facility] for [facility]

## 2023-04-09 ENCOUNTER — Telehealth: Payer: Self-pay | Admitting: Pharmacy Technician

## 2023-04-09 NOTE — Telephone Encounter (Addendum)
 Jenna, Fyi note:  Patient will be scheduled as soon as possible.  Auth Submission: NO AUTH NEEDED Site of care: Site of care: [facility] Payer: MEDICARE A/B & BCBS SUPP Medication & CPT/J Code(s) submitted: Leqvio  (Inclisiran) J1306 Route of submission (phone, fax, portal):  Phone # Fax # Auth type: Buy/Bill Units/visits requested: 2 Reference number:  Approval from: 04/02/23 to 10/18/24

## 2023-04-26 NOTE — Telephone Encounter (Signed)
Leqvio injection scheduled 05/09/23

## 2023-05-09 ENCOUNTER — Ambulatory Visit (INDEPENDENT_AMBULATORY_CARE_PROVIDER_SITE_OTHER): Payer: Medicare Other

## 2023-05-09 VITALS — BP 168/91 | HR 75 | Temp 98.1°F | Resp 16 | Ht 70.0 in | Wt 173.0 lb

## 2023-05-09 DIAGNOSIS — Z789 Other specified health status: Secondary | ICD-10-CM

## 2023-05-09 DIAGNOSIS — E785 Hyperlipidemia, unspecified: Secondary | ICD-10-CM | POA: Diagnosis not present

## 2023-05-09 MED ORDER — INCLISIRAN SODIUM 284 MG/1.5ML ~~LOC~~ SOSY
284.0000 mg | PREFILLED_SYRINGE | Freq: Once | SUBCUTANEOUS | Status: AC
  Administered 2023-05-09: 284 mg via SUBCUTANEOUS
  Filled 2023-05-09: qty 1.5

## 2023-05-09 NOTE — Patient Instructions (Signed)
 Inclisiran Injection What is this medication? INCLISIRAN (in kli SIR an) treats high cholesterol. It works by decreasing bad cholesterol (such as LDL) in your blood. Changes to diet and exercise are often combined with this medication. This medicine may be used for other purposes; ask your health care provider or pharmacist if you have questions. COMMON BRAND NAME(S): LEQVIO What should I tell my care team before I take this medication? They need to know if you have any of these conditions: An unusual or allergic reaction to inclisiran, other medications, foods, dyes, or preservatives Pregnant or trying to get pregnant Breast-feeding How should I use this medication? This medication is injected under the skin. It is given by your care team in a hospital or clinic setting. Talk to your care team about the use of this medication in children. Special care may be needed. Overdosage: If you think you have taken too much of this medicine contact a poison control center or emergency room at once. NOTE: This medicine is only for you. Do not share this medicine with others. What if I miss a dose? Keep appointments for follow-up doses. It is important not to miss your dose. Call your care team if you are unable to keep an appointment. What may interact with this medication? Interactions are not expected. This list may not describe all possible interactions. Give your health care provider a list of all the medicines, herbs, non-prescription drugs, or dietary supplements you use. Also tell them if you smoke, drink alcohol, or use illegal drugs. Some items may interact with your medicine. What should I watch for while using this medication? Visit your care team for regular checks on your progress. Tell your care team if your symptoms do not start to get better or if they get worse. You may need blood work while you are taking this medication. What side effects may I notice from receiving this  medication? Side effects that you should report to your care team as soon as possible: Allergic reactions--skin rash, itching, hives, swelling of the face, lips, tongue, or throat Side effects that usually do not require medical attention (report these to your care team if they continue or are bothersome): Joint pain Pain, redness, or irritation at injection site This list may not describe all possible side effects. Call your doctor for medical advice about side effects. You may report side effects to FDA at 1-800-FDA-1088. Where should I keep my medication? This medication is given in a hospital or clinic. It will not be stored at home. NOTE: This sheet is a summary. It may not cover all possible information. If you have questions about this medicine, talk to your doctor, pharmacist, or health care provider.  2024 Elsevier/Gold Standard (2022-03-31 00:00:00)

## 2023-05-09 NOTE — Progress Notes (Signed)
Diagnosis: Hyperlipidemia  Provider:  Chilton Greathouse MD  Procedure: IV Infusion  Leqvio (inclisiran), Dose: 284 mg, Site: subcutaneous, Number of injections: 1  Post Care: Observation period completed. Right arm injection  Discharge: Condition: Good, Destination: Home . AVS Provided  Performed by:  Rico Ala, LPN

## 2023-05-28 ENCOUNTER — Ambulatory Visit
Admission: RE | Admit: 2023-05-28 | Discharge: 2023-05-28 | Disposition: A | Discharge status: Home / Self Care | Payer: Medicare Other | Source: Ambulatory Visit | Attending: Internal Medicine | Admitting: Internal Medicine

## 2023-05-31 ENCOUNTER — Other Ambulatory Visit: Payer: Medicare Other

## 2023-05-31 DIAGNOSIS — E7800 Pure hypercholesterolemia, unspecified: Secondary | ICD-10-CM

## 2023-06-01 LAB — HEPATIC FUNCTION PANEL
AG Ratio: 1.7 (calc) (ref 1.0–2.5)
ALT: 11 U/L (ref 6–29)
AST: 16 U/L (ref 10–35)
Albumin: 4.5 g/dL (ref 3.6–5.1)
Alkaline phosphatase (APISO): 67 U/L (ref 37–153)
Bilirubin, Direct: 0.1 mg/dL (ref 0.0–0.2)
Globulin: 2.7 g/dL (ref 1.9–3.7)
Indirect Bilirubin: 0.3 mg/dL (ref 0.2–1.2)
Total Bilirubin: 0.4 mg/dL (ref 0.2–1.2)
Total Protein: 7.2 g/dL (ref 6.1–8.1)

## 2023-06-01 LAB — LIPID PANEL
Cholesterol: 293 mg/dL — ABNORMAL HIGH (ref <200)
HDL: 69 mg/dL (ref >=50)
LDL Cholesterol (Calc): 204 mg/dL — ABNORMAL HIGH
Non-HDL Cholesterol (Calc): 224 mg/dL — ABNORMAL HIGH (ref <130)
Total CHOL/HDL Ratio: 4.2 (calc) (ref <5.0)
Triglycerides: 84 mg/dL (ref <150)

## 2023-06-04 ENCOUNTER — Ambulatory Visit: Payer: Medicare Other | Admitting: Internal Medicine

## 2023-06-04 NOTE — Progress Notes (Signed)
Patient Care Team: Margaree Mackintosh, MD as PCP - General (Internal Medicine) Magrinat, Valentino Hue, MD (Inactive) as Consulting Physician (Oncology) Ovidio Kin, MD as Consulting Physician (General Surgery) Margaretmary Dys, MD as Consulting Physician (Radiation Oncology)  Visit Date: 06/11/23  Subjective:    Patient ID: Jamie Gilbert , Female   DOB: Jan 18, 1956, 67 y.o.    MRN: [id number]   67 y.o. Female presents today for a 6 month follow-up. She has a history of breast cancer, hyperlipidemia, Vitamin D deficiency.   [place]ed [place] notice light [place] but this is not bothering her.  History of Vitamin D deficiency treated with Drisdol 50,000 units once weekly.  She is worried about her husband who is having memory issues. She continues to support her daughter who is undergoing cancer treatment.   She continues to smoke and does not wish to quit at this time.  History of hyperlipidemia treated with Leqvio every 3 months for first two doses then every 6 months. She has had the first injection. Does not tolerate statins due to brain fog and myalgias. Followed by cardiologist, Dr. Rennis Golden. CHOL elevated at 293, LDL elevated at 204 on 05/31/23, down from 349 and 259 on 11/27/22.   Mammogram last completed 05/30/23. No mammographic evidence of malignancy. Repeat recommended in 2025.  DEXA scan last completed 12/02/18. BMD measured at Femur Neck Left is 0.764 g/cm2 with a T-score of -2.0. This patient is considered osteopenic.    Coronary calcium score at 224 on 03/29/22.  Past Medical History:  Diagnosis Date   Breast cancer (HCC)    DCIS   Hyperlipidemia    Personal history of radiation therapy    PONV (postoperative nausea and vomiting)    Vitamin D deficiency      Family History  Problem Relation Age of Onset   Hypertension Mother    Aortic aneurysm Mother    Brain cancer Father    Prostate cancer Brother 62   Rectal cancer Daughter      Social History   Social History Narrative   Social history: She is married.  Social alcohol consumption.  2 adult daughters.  Husband is retired from [employer] conservation [employer] business.  They have been remodeling their home for several months during the pandemic and living in their basement which has been stressful.  This is her second marriage.       Family history: Mother died with abdominal aortic aneurysm around age 52.  She had a history of hypertension was a former smoker.  Father died at age 5 with a brain tumor.  3 brothers in their 61s in good health with exception of hyperlipidemia.  No sisters.      Review of Systems  Constitutional:  Negative for fever and malaise/fatigue.  HENT:  Negative for congestion.   Eyes:  Negative for blurred vision.  Respiratory:  Negative for cough and shortness of breath.   Cardiovascular:  Negative for chest pain, palpitations and leg swelling.  Gastrointestinal:  Negative for vomiting.  Musculoskeletal:  Negative for back pain.  Skin:  Negative for rash.  Neurological:  Negative for loss of consciousness and headaches.        Objective:   Vitals: BP (!) 120/90   Pulse 70   Ht 5\' 10"  (1.778 m)   Wt 174 lb (78.9 kg)   SpO2 98%   BMI 24.97 kg/m    Physical Exam Vitals and nursing note reviewed.  Constitutional:  General: She is not in acute distress.    Appearance: Normal appearance. She is not toxic-appearing.  HENT:     Head: Normocephalic and atraumatic.  Cardiovascular:     Rate and Rhythm: Normal rate and regular rhythm. No extrasystoles are present.    Pulses: Normal pulses.     Heart sounds: Normal heart sounds. No murmur heard.    No friction rub. No gallop.  Pulmonary:     Effort: Pulmonary effort is normal. No respiratory distress.     Breath sounds: No rales.     Comments: Slight inspiratory wheezing right upper lobe. Skin:    General: Skin is warm and dry.  Neurological:     Mental Status: She is alert and  oriented to person, place, and time. Mental status is at baseline.  Psychiatric:        Mood and Affect: Mood normal.        Behavior: Behavior normal.        Thought Content: Thought content normal.        Judgment: Judgment normal.       Results:   Studies obtained and personally reviewed by me:  Mammogram last completed 05/30/23. No mammographic evidence of malignancy. Repeat recommended in 2025.  DEXA scan last completed 12/02/18. BMD measured at Femur Neck Left is 0.764 g/cm2 with a T-score of -2.0. This patient is considered osteopenic.    Coronary calcium score at 224 on 03/29/22.   Labs:       Component Value Date/Time   NA 140 11/27/2022 0957   NA 140 07/25/2017 0917   K 5.0 11/27/2022 0957   K 3.7 07/25/2017 0917   CL 104 11/27/2022 0957   CO2 28 11/27/2022 0957   CO2 27 07/25/2017 0917   GLUCOSE 108 (H) 11/27/2022 0957   GLUCOSE 126 07/25/2017 0917   BUN 11 11/27/2022 0957   BUN 10.3 07/25/2017 0917   CREATININE 0.77 11/27/2022 0957   CREATININE 0.8 07/25/2017 0917   CALCIUM 9.7 11/27/2022 0957   CALCIUM 9.3 07/25/2017 0917   PROT 7.2 05/31/2023 0922   PROT 7.3 07/25/2017 0917   ALBUMIN 4.0 07/25/2017 0917   AST 16 05/31/2023 0922   AST 17 07/25/2017 0917   ALT 11 05/31/2023 0922   ALT 14 07/25/2017 0917   ALKPHOS 56 07/25/2017 0917   BILITOT 0.4 05/31/2023 0922   BILITOT 0.46 07/25/2017 0917   GFRNONAA 84 10/13/2019 1134   GFRAA 98 10/13/2019 1134     Lab Results  Component Value Date   WBC 5.0 11/27/2022   HGB 13.6 11/27/2022   HCT 40.1 11/27/2022   MCV 93.5 11/27/2022   PLT 381 11/27/2022    Lab Results  Component Value Date   CHOL 293 (H) 05/31/2023   HDL 69 05/31/2023   LDLCALC 204 (H) 05/31/2023   TRIG 84 05/31/2023   CHOLHDL 4.2 05/31/2023    Lab Results  Component Value Date   HGBA1C 5.7 (H) 11/22/2015     Lab Results  Component Value Date   TSH 1.99 11/27/2022      Assessment & Plan:   Vitamin D deficiency: treated  with Drisdol 50,000 units once weekly.  We discussed her situational stress relating to her husband's memory issues. She continues to support her daughter who is undergoing cancer treatment.   She continues to smoke and does not wish to quit at this time.  Hyperlipidemia: treated with Leqvio every 3 months for first two doses then every 6 months.  She has had the first injection. Does not tolerate statins due to brain fog and myalgias. Followed by cardiologist, Dr. Rennis Golden. CHOL elevated at 293, LDL elevated at 204 on 05/31/23, down from 349 and 259 on 11/27/22.    Mammogram last completed 05/30/23. No mammographic evidence of malignancy. Repeat recommended in 2025.  Cologuard ordered.  Vaccine counseling: she will get an egg-free flu vaccine at the pharmacy.  Return in 6 months for health maintenance exam or as needed.    I,Alexander Ruley,acting as a Neurosurgeon for Margaree Mackintosh, MD.,have documented all relevant documentation on the behalf of Margaree Mackintosh, MD,as directed by  Margaree Mackintosh, MD while in the presence of Margaree Mackintosh, MD.   I, Margaree Mackintosh, MD, have reviewed all documentation for this visit. The documentation on 06/11/23 for the exam, diagnosis, procedures, and orders are all accurate and complete.

## 2023-06-11 ENCOUNTER — Ambulatory Visit (INDEPENDENT_AMBULATORY_CARE_PROVIDER_SITE_OTHER): Payer: Medicare Other | Admitting: Internal Medicine

## 2023-06-11 ENCOUNTER — Encounter: Payer: Self-pay | Admitting: Internal Medicine

## 2023-06-11 VITALS — BP 120/90 | HR 70 | Ht 70.0 in | Wt 174.0 lb

## 2023-06-11 DIAGNOSIS — E7800 Pure hypercholesterolemia, unspecified: Secondary | ICD-10-CM

## 2023-06-11 DIAGNOSIS — E559 Vitamin D deficiency, unspecified: Secondary | ICD-10-CM

## 2023-06-11 DIAGNOSIS — F439 Reaction to severe stress, unspecified: Secondary | ICD-10-CM

## 2023-06-11 DIAGNOSIS — Z789 Other specified health status: Secondary | ICD-10-CM

## 2023-06-11 DIAGNOSIS — Z87891 Personal history of nicotine dependence: Secondary | ICD-10-CM

## 2023-06-11 DIAGNOSIS — Z1211 Encounter for screening for malignant neoplasm of colon: Secondary | ICD-10-CM

## 2023-06-11 DIAGNOSIS — Z853 Personal history of malignant neoplasm of breast: Secondary | ICD-10-CM

## 2023-06-11 NOTE — Patient Instructions (Signed)
Continue Leqvio per Cardiologist. Continue high dose Vitamin D weekly. Have flu vaccine(egg-free) through pharmacy. Please stop smoking. RTC for Medicare wellness and health maintenance exam in 6 months. It was good to see you today.

## 2023-06-14 ENCOUNTER — Ambulatory Visit: Payer: Medicare Other | Admitting: Internal Medicine

## 2023-06-26 LAB — COLOGUARD: COLOGUARD: NEGATIVE

## 2023-08-08 ENCOUNTER — Other Ambulatory Visit: Payer: Self-pay

## 2023-08-08 MED ORDER — IBUPROFEN 800 MG PO TABS: 800.0000 mg | ORAL_TABLET | Freq: Three times a day (TID) | ORAL | 0 refills | Status: AC | PRN

## 2023-08-08 NOTE — Telephone Encounter (Signed)
Copied from CRM [id number]. Topic: Clinical - Medication Refill >> Aug 08, 2023 11:42 AM Theodis Sato wrote: Most Recent Primary Care Visit:  Provider: Margaree Mackintosh  Department: [facility]  Visit Type: OFFICE VISIT  Date: 06/11/2023  Medication: ibuprofen (ADVIL) 800 MG tablet  Has the patient contacted their pharmacy? Yes (Agent: If no, request that the patient contact the pharmacy for the refill. If patient does not wish to contact the pharmacy document the reason why and proceed with request.) (Agent: If yes, when and what did the pharmacy advise?)  Is this the correct pharmacy for this prescription? Yes If no, delete pharmacy and type the correct one.  This is the patient's preferred pharmacy:  [facility] [place], [place] - [address] [facility] [address] [place] [address] Phone: [phone] Fax: [phone]   Has the prescription been filled recently? No  Is the patient out of the medication? No  Has the patient been seen for an appointment in the last year OR does the patient have an upcoming appointment? Yes  Can we respond through MyChart? NO  Agent: Please be advised that Rx refills may take up to 3 business days. We ask that you follow-up with your pharmacy.

## 2023-08-09 ENCOUNTER — Ambulatory Visit: Payer: Medicare Other

## 2023-08-09 VITALS — BP 149/94 | HR 78 | Temp 98.4°F | Resp 18 | Ht 70.0 in | Wt 178.8 lb

## 2023-08-09 DIAGNOSIS — E785 Hyperlipidemia, unspecified: Secondary | ICD-10-CM

## 2023-08-09 DIAGNOSIS — Z789 Other specified health status: Secondary | ICD-10-CM | POA: Diagnosis not present

## 2023-08-09 MED ORDER — INCLISIRAN SODIUM 284 MG/1.5ML ~~LOC~~ SOSY
284.0000 mg | PREFILLED_SYRINGE | Freq: Once | SUBCUTANEOUS | Status: AC
  Administered 2023-08-09: 284 mg via SUBCUTANEOUS
  Filled 2023-08-09: qty 1.5

## 2023-08-09 NOTE — Progress Notes (Signed)
Diagnosis: Hyperlipidemia  Provider:  Chilton Greathouse MD  Procedure: Injection  Leqvio (inclisiran), Dose: 284 mg, Site: subcutaneous, Number of injections: 1  Post Care:  right arm injection  Discharge: Condition: Good, Destination: Home . AVS Declined  Performed by:  Rico Ala, LPN

## 2023-11-27 ENCOUNTER — Other Ambulatory Visit

## 2023-11-27 DIAGNOSIS — Z0000 Encounter for general adult medical examination without abnormal findings: Secondary | ICD-10-CM

## 2023-11-27 DIAGNOSIS — Z789 Other specified health status: Secondary | ICD-10-CM

## 2023-11-27 DIAGNOSIS — Z1329 Encounter for screening for other suspected endocrine disorder: Secondary | ICD-10-CM

## 2023-11-27 DIAGNOSIS — E7800 Pure hypercholesterolemia, unspecified: Secondary | ICD-10-CM

## 2023-11-27 DIAGNOSIS — R7309 Other abnormal glucose: Secondary | ICD-10-CM

## 2023-11-28 LAB — CBC WITH DIFFERENTIAL/PLATELET
Absolute Lymphocytes: 1637 {cells}/uL (ref 850–3900)
Absolute Monocytes: 347 {cells}/uL (ref 200–950)
Basophils Absolute: 61 {cells}/uL (ref 0–200)
Basophils Relative: 1.2 %
Eosinophils Absolute: 41 {cells}/uL (ref 15–500)
Eosinophils Relative: 0.8 %
HCT: 40.6 % (ref 35.0–45.0)
Hemoglobin: 13.6 g/dL (ref 11.7–15.5)
MCH: 31.4 pg (ref 27.0–33.0)
MCHC: 33.5 g/dL (ref 32.0–36.0)
MCV: 93.8 fL (ref 80.0–100.0)
MPV: 9.3 fL (ref 7.5–12.5)
Monocytes Relative: 6.8 %
Neutro Abs: 3014 {cells}/uL (ref 1500–7800)
Neutrophils Relative %: 59.1 %
Platelets: 362 10*3/uL (ref 140–400)
RBC: 4.33 10*6/uL (ref 3.80–5.10)
RDW: 12.6 % (ref 11.0–15.0)
Total Lymphocyte: 32.1 %
WBC: 5.1 10*3/uL (ref 3.8–10.8)

## 2023-11-28 LAB — LIPID PANEL
Cholesterol: 294 mg/dL — ABNORMAL HIGH (ref <200)
HDL: 67 mg/dL (ref >=50)
LDL Cholesterol (Calc): 198 mg/dL — ABNORMAL HIGH
Non-HDL Cholesterol (Calc): 227 mg/dL — ABNORMAL HIGH (ref <130)
Total CHOL/HDL Ratio: 4.4 (calc) (ref <5.0)
Triglycerides: 138 mg/dL (ref <150)

## 2023-11-28 LAB — COMPLETE METABOLIC PANEL WITH GFR
AG Ratio: 2.1 (calc) (ref 1.0–2.5)
ALT: 12 U/L (ref 6–29)
AST: 15 U/L (ref 10–35)
Albumin: 4.6 g/dL (ref 3.6–5.1)
Alkaline phosphatase (APISO): 68 U/L (ref 37–153)
BUN: 10 mg/dL (ref 7–25)
CO2: 29 mmol/L (ref 20–32)
Calcium: 9.6 mg/dL (ref 8.6–10.4)
Chloride: 102 mmol/L (ref 98–110)
Creat: 0.79 mg/dL (ref 0.50–1.05)
Globulin: 2.2 g/dL (ref 1.9–3.7)
Glucose, Bld: 101 mg/dL — ABNORMAL HIGH (ref 65–99)
Potassium: 4.6 mmol/L (ref 3.5–5.3)
Sodium: 138 mmol/L (ref 135–146)
Total Bilirubin: 0.5 mg/dL (ref 0.2–1.2)
Total Protein: 6.8 g/dL (ref 6.1–8.1)
eGFR: 81 mL/min/{1.73_m2} (ref >=60)

## 2023-11-28 LAB — TSH: TSH: 2.51 m[IU]/L (ref 0.40–4.50)

## 2023-11-28 LAB — HEMOGLOBIN A1C
Hgb A1c MFr Bld: 5.8 %{Hb} — ABNORMAL HIGH (ref <5.7)
Mean Plasma Glucose: 120 mg/dL
eAG (mmol/L): 6.6 mmol/L

## 2023-11-30 ENCOUNTER — Other Ambulatory Visit: Payer: Medicare Other

## 2023-11-30 NOTE — Progress Notes (Addendum)
 Annual Wellness Visit   Patient Care Team: Mikael Debell, Luanna Cole, MD as PCP - General (Internal Medicine) Magrinat, Valentino Hue, MD (Inactive) as Consulting Physician (Oncology) Ovidio Kin, MD as Consulting Physician (General Surgery) Margaretmary Dys, MD as Consulting Physician (Radiation Oncology)  Visit Date: 12/04/23   Chief Complaint  Patient presents with   Medicare Wellness   Annual Exam   Subjective:  Patient: Jamie Gilbert, Female DOB: [birth year], 68 y.o. MRN: [id number] Vitals:   12/04/23 1055 12/04/23 1102  BP: (!) 142/82 132/80  Jamie Gilbert is a 68 y.o. Female who presents today for her Annual Wellness Visit. Patient has history of Hyperlipidemia; Smoking History; Vitamin D Deficiency; History of COPD; Neoplasm of Breast, Primary Tumor Staging Category tis: Ductal Carcinoma in Situ (DCIS), Left; and Statin Intolerance.  Blood Pressure today initially elevated at 142/82, decreased to 132/80.   Says that she has some drainage from her left eye, will be gooey before drying and becoming crusty. Also noticed a bump on her posterior left shoulder that she thinks may be an ingrown hair becoming infected.   History of Smoking (currently), 01/05/2023 Lung Cancer Screening found benign appearances/behaviors of nodules, a tiny hiatal hernia, Aortic Atherosclerosis, and Emphysema. Recommended repeat 2025.  History of Vitamin-D Deficiency treated with Drisdol 50,000 units once weekly.   History of Hyperlipidemia treated with Leqvio every 3 months for first two doses then every 6 months. She has had the first 2 injections, next will be due 12/2023. Followed by cardiologist, Dr. Rennis Golden. In 2011 she was started on Zocor but was not compliant as she does not tolerate statins due to brain fog and myalgias. Coronary Calcium Score at 224 on 03/29/22. 11/27/2023 Lipid Panel, compared to 11/2022 & 05/2023: Cholesterol 294, decreased from 349, slightly elevated from 293; LDL 198, decreased from  259, decreased from 204.  Labs 11/27/2023 CBC: WNL CMP, compared to 11/2022: Blood Glucose 101, decreased from 108; otherwise WNL.   HgbA1c, compared to 2017: 5.8, elevated from 5.7 TSH: 2.51  PAP Smear 10/14/2019 normal.  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 Antiestrogen therapy was recommended. She did poorly with Anastrozole so she was switched to Exemestane but was not motivated to stay on it. Previously followed by Dr. Darnelle Catalan, who has since retired. Mammogram 05/28/2023 normal with repeat recommendation of 2025.  Colo-guard 06/2023 NEGATIVE w/ recommended repeat 2027.  Bone Density 12/02/2018 T-score Left Femur Neck -2.0, osteopenic.   Vaccine Counseling: Due for Flu, Covid-19, Shingles 1/2, PNA, and Tdap. Discussed - Postponed. Past Medical History:  Diagnosis Date   Breast cancer (HCC)    DCIS   Hyperlipidemia    Personal history of radiation therapy    PONV (postoperative nausea and vomiting)    Vitamin D deficiency   Medical/Surgical History Narrative:  2000 - Right Middle Lobe Pneumonia in January. Infectious Colitis in February  1997 - Torn Medial Meniscus of Left Knee w/ Plica being excised, subsequently developed Regional Pain Syndrome postsurgery which eventually improved.  1990 - Ectopic Pregnancy & Bilateral Tubal Ligation  Family History  Problem Relation Age of Onset   Hypertension Mother    Aortic aneurysm Mother    Brain cancer Father    Prostate cancer Brother 74   Rectal cancer Daughter     Social History   Social History Narrative   Married -  husband is retired from [employer] - this is her second marriage. Social alcohol consumption.  2 adult daughters. During the pandemic they remodeled their home for several months, living in their  basement which was stressful.     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 Husband is having memory issues, worrying Mrs. Rausch. She has also been supporting her daughter, who is undergoing cancer treatment - recently had surgery regarding this in December which she is recovering from.        Fhx:   Mother deceased from Abdominal Aortic Aneurysm around age 47 w/ hx of Hypertension - former smoker.     Father deceased at age 44 w/ a Brain Tumor.     3 Brothers in their 53s, 1 w/ Hyperlipidemia, but otherwise within good health. No sisters   Daughter w/ Rectal Cancer   Review of Systems  Constitutional:  Negative for chills, fever, malaise/fatigue and weight loss.  HENT:  Negative for hearing loss, sinus pain and sore throat.   Eyes:  Positive for discharge (left eye).  Respiratory:  Negative for cough, hemoptysis and shortness of breath.   Cardiovascular:  Negative for chest pain, palpitations, leg swelling and PND.  Gastrointestinal:  Negative for abdominal pain, constipation, diarrhea, heartburn, nausea and vomiting.  Genitourinary:  Negative for dysuria, frequency and urgency.  Musculoskeletal:  Negative for back pain, myalgias and neck pain.  Skin:  Negative for itching and rash.       (+) Bump - left shoulder, not painful (+) Lesion - under left eye  Neurological:  Negative for dizziness, tingling, seizures and headaches.  Endo/Heme/Allergies:  Negative for polydipsia.  Psychiatric/Behavioral:  Negative for depression. The patient is not nervous/anxious.     Objective:  Vitals: BP 132/80   Pulse 85   Temp 98.2 F (36.8 C)   Ht 5\' 10"  (1.778 m)   Wt 178 lb (80.7 kg)   SpO2 97%   BMI 25.54 kg/m  Physical Exam Vitals and nursing note reviewed.  Constitutional:      General: She is not in acute distress.    Appearance: Normal appearance. She is not ill-appearing or toxic-appearing.  HENT:     Head: Normocephalic and atraumatic.      Right Ear: Hearing, tympanic membrane, ear canal and external ear normal.     Left Ear: Hearing, tympanic membrane, ear canal and external ear normal.     Mouth/Throat:     Pharynx: Oropharynx is clear.  Eyes:     Extraocular Movements: Extraocular movements intact.     Pupils: Pupils are equal, round, and reactive to light.     Comments: Lesion under left eye  Neck:     Thyroid: No thyroid mass, thyromegaly or thyroid tenderness.     Vascular: No carotid bruit.  Cardiovascular:     Rate and Rhythm: Normal rate and regular rhythm. No extrasystoles are present.    Pulses:          Dorsalis pedis pulses are 2+ on the right side and 2+ on the left side.     Heart sounds: Normal heart sounds. No murmur heard.    No friction rub. No gallop.  Pulmonary:     Effort: Pulmonary effort is normal.     Breath sounds: Normal breath sounds. No decreased breath sounds, wheezing, rhonchi or rales.  Chest:     Chest wall: No mass.  Abdominal:     Palpations: Abdomen is soft.  There is no hepatomegaly, splenomegaly or mass.     Tenderness: There is no abdominal tenderness.     Hernia: No hernia is present.  Genitourinary:    Rectum: Guaiac result negative.     Comments: Bimanual normal, PAP deferred Musculoskeletal:     Cervical back: Normal range of motion.     Right lower leg: No edema.     Left lower leg: No edema.  Lymphadenopathy:     Cervical: No cervical adenopathy.     Upper Body:     Right upper body: No supraclavicular adenopathy.     Left upper body: No supraclavicular adenopathy.  Skin:    General: Skin is warm and dry.     Comments: Posterior Left Shoulder, possible cyst slightly smaller than a dime, ~4-5 mm width, not red  Neurological:     General: No focal deficit present.     Mental Status: She is alert and oriented to person, place, and time. Mental status is at baseline.     Sensory: Sensation is intact.     Motor: Motor function is intact. No weakness.     Deep  Tendon Reflexes: Reflexes are normal and symmetric.  Psychiatric:        Attention and Perception: Attention normal.        Mood and Affect: Mood normal.        Speech: Speech normal.        Behavior: Behavior normal.        Thought Content: Thought content normal.        Cognition and Memory: Cognition normal.        Judgment: Judgment normal.   Most Recent Functional Status Assessment:    12/04/2023   10:54 AM  In your present state of health, do you have any difficulty performing the following activities:  Hearing? 0  Vision? 0  Difficulty concentrating or making decisions? 0  Walking or climbing stairs? 0  Dressing or bathing? 0  Doing errands, shopping? 0  Preparing Food and eating ? N  Using the Toilet? N  In the past six months, have you accidently leaked urine? N  Do you have problems with loss of bowel control? N  Managing your Medications? N  Managing your Finances? N  Housekeeping or managing your Housekeeping? N   Most Recent Fall Risk Assessment:    11/28/2022   11:13 AM  Fall Risk   Falls in the past year? 0  Number falls in past yr: 0  Injury with Fall? 0  Risk for fall due to : No Fall Risks  Follow up Falls prevention discussed   Most Recent Depression Screenings:    11/28/2022   11:13 AM 06/05/2022   11:15 AM  PHQ 2/9 Scores  PHQ - 2 Score 0 0   Most Recent Cognitive Screening:    11/28/2022   11:14 AM  6CIT Screen  What time? 0 points  Count back from 20 0 points  Months in reverse 0 points  Repeat phrase 0 points   Results:  Studies Obtained And Personally Reviewed By Me:  PAP Smear 10/14/2019 normal.  Mammogram 05/28/2023 normal with repeat recommendation of 2025.  Bone Density 12/02/2018 T-score Left Femur Neck -2.0, osteopenic.   CT CHEST WITHOUT CONTRAST LOW-DOSE FOR LUNG CANCER SCREENING 01/08/2023  FINDINGS: Cardiovascular: Aortic atherosclerosis. Tortuous thoracic aorta. Normal heart size, without pericardial effusion. Lad and  left circumflex coronary artery calcification.   Mediastinum/Nodes: No mediastinal or hilar adenopathy, given limitations of  unenhanced CT. Tiny hiatal hernia.   Lungs/Pleura: No pleural fluid. Mild centrilobular emphysema. Smoking related respiratory bronchiolitis. Right lower lobe solid nodule volume derived equivalent diameter 2.5 mm. A non solid anterior left lower lobe pulmonary nodule of volume derived equivalent diameter 5.7 mm.   Upper Abdomen: High right hepatic lobe bilobed 2.2 cm cyst. Normal imaged portions of the spleen, pancreas, gallbladder, adrenal glands, kidneys.   Musculoskeletal: No acute osseous abnormality.   IMPRESSION: 1. Lung-RADS 2, benign appearance or behavior. Continue annual screening with low-dose chest CT without contrast in 12 months.  2. Tiny hiatal hernia.  3. Aortic Atherosclerosis (ICD10-I70.0) and Emphysema (ICD10-J43.9). Coronary artery atherosclerosis.  Labs:     Component Value Date/Time   NA 138 11/27/2023 0949   NA 140 07/25/2017 0917   K 4.6 11/27/2023 0949   K 3.7 07/25/2017 0917   CL 102 11/27/2023 0949   CO2 29 11/27/2023 0949   CO2 27 07/25/2017 0917   GLUCOSE 101 (H) 11/27/2023 0949   GLUCOSE 126 07/25/2017 0917   BUN 10 11/27/2023 0949   BUN 10.3 07/25/2017 0917   CREATININE 0.79 11/27/2023 0949   CREATININE 0.8 07/25/2017 0917   CALCIUM 9.6 11/27/2023 0949   CALCIUM 9.3 07/25/2017 0917   PROT 6.8 11/27/2023 0949   PROT 7.3 07/25/2017 0917   ALBUMIN 4.0 07/25/2017 0917   AST 15 11/27/2023 0949   AST 17 07/25/2017 0917   ALT 12 11/27/2023 0949   ALT 14 07/25/2017 0917   ALKPHOS 56 07/25/2017 0917   BILITOT 0.5 11/27/2023 0949   BILITOT 0.46 07/25/2017 0917   GFRNONAA 84 10/13/2019 1134   GFRAA 98 10/13/2019 1134    Lab Results  Component Value Date   WBC 5.1 11/27/2023   HGB 13.6 11/27/2023   HCT 40.6 11/27/2023   MCV 93.8 11/27/2023   PLT 362 11/27/2023   Lab Results  Component Value Date   CHOL 294  (H) 11/27/2023   HDL 67 11/27/2023   LDLCALC 198 (H) 11/27/2023   TRIG 138 11/27/2023   CHOLHDL 4.4 11/27/2023   Lab Results  Component Value Date   HGBA1C 5.8 (H) 11/27/2023    Lab Results  Component Value Date   TSH 2.51 11/27/2023    Assessment & Plan:   Orders Placed This Encounter  Procedures   POCT Urinalysis Dipstick (Automated)  Other Labs Reviewed today: CBC: WNL CMP, compared to 11/2022: Blood Glucose 101, decreased from 108; otherwise WNL.   HgbA1c, compared to 2017: 5.8, elevated from 5.7 TSH: 2.51  Drainage, Left Eye; Facial Lesion: Instructed to apply a warm compress to eye. Lesion underneath left eye, which patient though was related to her drainage. Referral to Dermatology to evaluate lesion. Use ocuflox in left eye as there appears to be conjunctivitis  Mass, Back: possible cyst on posterior left shoulder. Not painful according to patient. Can have this incised and drained if it becomes an issue.    Smoking (currently), 01/05/2023 Lung Cancer Screening found benign appearances/behaviors of nodules, a tiny hiatal hernia, Aortic Atherosclerosis, and Emphysema. Recommended repeat 2025.  Vitamin-D Deficiency treated with Drisdol 50,000 units once weekly.   Hyperlipidemia treated with Leqvio every 3 months for first two doses then every 6 months. She has had the first 2 injections, next will be due 12/2023. Followed by cardiologist, Dr. Rennis Golden. Coronary Calcium Score at 224 on 03/29/22. 11/27/2023 Lipid Panel, compared to 11/2022 & 05/2023: Cholesterol 294, decreased from 349, slightly elevated from 293; LDL 198, decreased from 259,  decreased from 204.  PAP Smear 10/14/2019 normal.  History of Breast Cancer; Mammogram 05/28/2023 normal with repeat recommendation of 2025.  Colo-guard 06/2023 NEGATIVE w/ recommended repeat 2027.  Bone Density 12/02/2018 T-score Left Femur Neck -2.0, osteopenic.   Vaccine Counseling: Due for Flu, Covid-19, Shingles 1/2, PNA, and Tdap.  Discussed - Postponed.   Annual wellness visit done today including the all of the following: Reviewed patient's Family Medical History Reviewed and updated list of patient's medical providers Assessment of cognitive impairment was done Assessed patient's functional ability Established a written schedule for health screening services Health Risk Assessent Completed and Reviewed  Discussed health benefits of physical activity, and encouraged her to engage in regular exercise appropriate for her age and condition.    I,Emily Lagle,acting as a Neurosurgeon for Margaree Mackintosh, MD.,have documented all relevant documentation on the behalf of Margaree Mackintosh, MD,as directed by  Margaree Mackintosh, MD while in the presence of Margaree Mackintosh, MD.   I, Margaree Mackintosh, MD, have reviewed all documentation for this visit. The documentation on 12/15/23 for the exam, diagnosis, procedures, and orders are all accurate and complete.

## 2023-12-04 ENCOUNTER — Encounter: Payer: Self-pay | Admitting: Internal Medicine

## 2023-12-04 ENCOUNTER — Ambulatory Visit: Payer: Medicare Other | Admitting: Internal Medicine

## 2023-12-04 VITALS — BP 132/80 | HR 85 | Temp 98.2°F | Ht 70.0 in | Wt 178.0 lb

## 2023-12-04 DIAGNOSIS — Z0000 Encounter for general adult medical examination without abnormal findings: Secondary | ICD-10-CM

## 2023-12-04 DIAGNOSIS — H1032 Unspecified acute conjunctivitis, left eye: Secondary | ICD-10-CM

## 2023-12-04 DIAGNOSIS — E559 Vitamin D deficiency, unspecified: Secondary | ICD-10-CM

## 2023-12-04 DIAGNOSIS — F439 Reaction to severe stress, unspecified: Secondary | ICD-10-CM

## 2023-12-04 DIAGNOSIS — R2232 Localized swelling, mass and lump, left upper limb: Secondary | ICD-10-CM

## 2023-12-04 DIAGNOSIS — Z853 Personal history of malignant neoplasm of breast: Secondary | ICD-10-CM

## 2023-12-04 DIAGNOSIS — E7800 Pure hypercholesterolemia, unspecified: Secondary | ICD-10-CM

## 2023-12-04 DIAGNOSIS — Z789 Other specified health status: Secondary | ICD-10-CM

## 2023-12-04 DIAGNOSIS — R7302 Impaired glucose tolerance (oral): Secondary | ICD-10-CM

## 2023-12-04 DIAGNOSIS — Z87891 Personal history of nicotine dependence: Secondary | ICD-10-CM

## 2023-12-04 DIAGNOSIS — M7918 Myalgia, other site: Secondary | ICD-10-CM

## 2023-12-04 LAB — POC URINALSYSI DIPSTICK (AUTOMATED)
Bilirubin, UA: NEGATIVE
Glucose, UA: NEGATIVE
Ketones, UA: NEGATIVE
Nitrite, UA: NEGATIVE
Protein, UA: NEGATIVE
Spec Grav, UA: 1.015 (ref 1.010–1.025)
Urobilinogen, UA: NEGATIVE U/dL — AB
pH, UA: 7 (ref 5.0–8.0)

## 2023-12-05 MED ORDER — OFLOXACIN 0.3 % OP SOLN: OPHTHALMIC | 0 refills | Status: AC

## 2023-12-15 ENCOUNTER — Encounter: Payer: Self-pay | Admitting: Internal Medicine

## 2023-12-15 NOTE — Patient Instructions (Addendum)
 Ocuflox ophthalmic drops have been ordered for left eye drainage in possible infection. Cyst near left shoulder blade can be checked for removal by surgeon should you desire consultation. Please quit smoking. Continue Vitamin D supplement. Continue Leqvio for hyperlipidemia.

## 2024-02-07 ENCOUNTER — Ambulatory Visit (INDEPENDENT_AMBULATORY_CARE_PROVIDER_SITE_OTHER): Payer: Medicare Other

## 2024-02-07 VITALS — BP 143/84 | HR 80 | Temp 98.1°F | Resp 16 | Ht 70.0 in | Wt 172.2 lb

## 2024-02-07 DIAGNOSIS — Z789 Other specified health status: Secondary | ICD-10-CM

## 2024-02-07 DIAGNOSIS — E785 Hyperlipidemia, unspecified: Secondary | ICD-10-CM | POA: Diagnosis not present

## 2024-02-07 MED ORDER — INCLISIRAN SODIUM 284 MG/1.5ML ~~LOC~~ SOSY
284.0000 mg | PREFILLED_SYRINGE | Freq: Once | SUBCUTANEOUS | Status: AC
  Administered 2024-02-07: 284 mg via SUBCUTANEOUS
  Filled 2024-02-07: qty 1.5

## 2024-02-07 NOTE — Progress Notes (Signed)
 Diagnosis: Hyperlipidemia  Provider:  Chilton Greathouse MD  Procedure: Injection  Leqvio (inclisiran), Dose: 284 mg, Site: subcutaneous, Number of injections: 1  Injection Site(s): Right arm  Post Care: Patient declined observation  Discharge: Condition: Good, Destination: Home . AVS Declined  Performed by:  Wyvonne Lenz, RN

## 2024-04-28 ENCOUNTER — Other Ambulatory Visit: Payer: Self-pay | Admitting: Internal Medicine

## 2024-04-28 DIAGNOSIS — Z1231 Encounter for screening mammogram for malignant neoplasm of breast: Secondary | ICD-10-CM

## 2024-05-28 ENCOUNTER — Encounter: Payer: Self-pay | Admitting: Internal Medicine

## 2024-05-28 ENCOUNTER — Ambulatory Visit
Admission: RE | Admit: 2024-05-28 | Discharge: 2024-05-28 | Disposition: A | Discharge status: Home / Self Care | Source: Ambulatory Visit | Attending: Internal Medicine | Admitting: Internal Medicine

## 2024-05-28 DIAGNOSIS — Z1231 Encounter for screening mammogram for malignant neoplasm of breast: Secondary | ICD-10-CM

## 2024-08-11 ENCOUNTER — Ambulatory Visit: Admitting: *Deleted

## 2024-08-11 VITALS — BP 154/84 | HR 86 | Temp 99.3°F | Resp 16 | Ht 70.0 in | Wt 171.6 lb

## 2024-08-11 DIAGNOSIS — E785 Hyperlipidemia, unspecified: Secondary | ICD-10-CM

## 2024-08-11 DIAGNOSIS — Z789 Other specified health status: Secondary | ICD-10-CM

## 2024-08-11 MED ORDER — INCLISIRAN SODIUM 284 MG/1.5ML ~~LOC~~ SOSY
284.0000 mg | PREFILLED_SYRINGE | Freq: Once | SUBCUTANEOUS | Status: AC
  Administered 2024-08-11: 284 mg via SUBCUTANEOUS
  Filled 2024-08-11: qty 1.5

## 2024-08-11 NOTE — Progress Notes (Signed)
 Diagnosis: Hyperlipidemia  Provider:  Chilton Greathouse MD  Procedure: Injection  Leqvio (inclisiran), Dose: 284 mg, Site: subcutaneous, Number of injections: 1  Injection Site(s): Right arm  Post Care: Observation period completed  Discharge: Condition: Good, Destination: Home . AVS Declined  Performed by:  Forrest Moron, RN

## 2024-09-29 ENCOUNTER — Telehealth: Payer: Self-pay

## 2024-09-29 NOTE — Telephone Encounter (Signed)
 Auth Submission: NO AUTH NEEDED Site of care: Site of care: [facility] Payer: Medicare A/B with BCBS supplement Medication & CPT/J Code(s) submitted: Leqvio  (Inclisiran) J1306 Diagnosis Code:  Route of submission (phone, fax, portal):  Phone # Fax # Auth type: Buy/Bill PB Units/visits requested: 284mg  x 2 doses Reference number:  Approval from: 09/29/24 to 10/18/25

## 2025-02-10 ENCOUNTER — Ambulatory Visit
